# Patient Record
Sex: Female | Born: 1985
Health system: Southern US, Community
[De-identification: ages and names within clinical notes are randomized; demographics above are authoritative.]

## PROBLEM LIST (undated history)

## (undated) DIAGNOSIS — F329 Major depressive disorder, single episode, unspecified: Secondary | ICD-10-CM

## (undated) DIAGNOSIS — J45909 Unspecified asthma, uncomplicated: Secondary | ICD-10-CM

## (undated) DIAGNOSIS — F32A Depression, unspecified: Secondary | ICD-10-CM

## (undated) DIAGNOSIS — G43909 Migraine, unspecified, not intractable, without status migrainosus: Secondary | ICD-10-CM

## (undated) HISTORY — DX: Depression, unspecified: F32.A

## (undated) HISTORY — DX: Unspecified asthma, uncomplicated: J45.909

## (undated) HISTORY — DX: Major depressive disorder, single episode, unspecified: F32.9

---
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
## 2014-08-22 ENCOUNTER — Encounter: Payer: Self-pay | Admitting: Family Medicine

## 2014-08-22 ENCOUNTER — Ambulatory Visit (INDEPENDENT_AMBULATORY_CARE_PROVIDER_SITE_OTHER): Payer: 59 | Admitting: Family Medicine

## 2014-08-22 VITALS — BP 122/76 | HR 91 | Ht 67.0 in | Wt 211.0 lb

## 2014-08-22 DIAGNOSIS — M542 Cervicalgia: Secondary | ICD-10-CM | POA: Insufficient documentation

## 2014-08-22 DIAGNOSIS — M999 Biomechanical lesion, unspecified: Secondary | ICD-10-CM | POA: Insufficient documentation

## 2014-08-22 DIAGNOSIS — M9901 Segmental and somatic dysfunction of cervical region: Secondary | ICD-10-CM

## 2014-08-22 NOTE — Assessment & Plan Note (Signed)
Decision today to treat with OMT was based on Physical Exam  After verbal consent patient was treated with HVLA, ME, FPR techniques in cervical, rib thoracic areas  Patient tolerated the procedure well with improvement in symptoms  Patient given exercises, stretches and lifestyle modifications  See medications in patient instructions if given  Patient will follow up in 3-4 weeks

## 2014-08-22 NOTE — Progress Notes (Signed)
[name] [place], [place] [address] Phone: [phone] Subjective:     CC: shoulder and neck pain  LPN:[id number] Megan Dennis is a 28 y.o. female coming in with complaint of shoulder and neck pain. Patient states that this happened a couple weeks ago when she was doing a boot Class. Patient states that she was doing a lot of holding herself up. Patient states that the pain seemed to start in her shoulder and didn't seem to radiate up towards her neck. Patient states now it seems to be localized mostly in the left side of her neck as well as up towards her face. Patient states that she has significant tightness on the left side especially when she wakes up in the morning. Denies any radiation of the arm or any numbness. States that throughout the day it seems to improve. Patient rates the severity of pain is 4 out of 10. Still able to do daily activities without any significant discomfort. States though that the discomfort can keep her from falling asleep but does not wake her up at night.     Past medical history, social, surgical and family history all reviewed in electronic medical record.   Review of Systems: No headache, visual changes, nausea, vomiting, diarrhea, constipation, dizziness, abdominal pain, skin rash, fevers, chills, night sweats, weight loss, swollen lymph nodes, body aches, joint swelling, muscle aches, chest pain, shortness of breath, mood changes.   Objective Blood pressure 122/76, pulse 91, height 5\' 7"  (1.702 m), weight 211 lb (95.709 kg), SpO2 96 %.  General: No apparent distress alert and oriented x3 mood and affect normal, dressed appropriately.  HEENT: Pupils equal, extraocular movements intact  Respiratory: Patient's speak in full sentences and does not appear short of breath  Cardiovascular: No lower extremity edema, non tender, no erythema  Skin: Warm dry intact with no signs of infection or rash on extremities or  on axial skeleton.  Abdomen: Soft nontender  Neuro: Cranial nerves II through XII are intact, neurovascularly intact in all extremities with 2+ DTRs and 2+ pulses.  Lymph: No lymphadenopathy of posterior or anterior cervical chain or axillae bilaterally.  Gait normal with good balance and coordination.  MSK:  Non tender with full range of motion and good stability and symmetric strength and tone of  elbows, wrist, hip, knee and ankles bilaterally.  Neck: Inspection unremarkable. No palpable stepoffs. Negative Spurling's maneuver. Full neck range of motion Grip strength and sensation normal in bilateral hands Strength good C4 to T1 distribution No sensory change to C4 to T1 Negative Hoffman sign bilaterally Reflexes normal  Shoulder:left Inspection reveals no abnormalities, atrophy or asymmetry. Palpation is normal with no tenderness over AC joint or bicipital groove. ROM is full in all planes.patient does have increased range of motion compared to the contralateral side. Rotator cuff strength normal throughout. No signs of impingement with negative Neer and Hawkin's tests, empty can sign. Speeds and Yergason's tests normal. No labral pathology noted with negative Obrien's, negative clunk and good stability. Normal scapular function observed. No painful arc and no drop arm sign. No apprehension sign Contralateral shoulder unremarkable  OMT Physical Exam   Cervical  C2 flexed rotated and side bent right  Thoracic T1 extended rotated and side bent left with elevated first rib T5 E RS right   Lumbar L2 flexed rotated and side bent right  Sacrum  Illium        Impression and Recommendations:  This case required medical decision making of moderate complexity.

## 2014-08-22 NOTE — Patient Instructions (Addendum)
Very nice to meet you Ice 20 minutes 2 times daily. Usually after activity and before bed. Exercises 3 times a week.  Ibuprofen 600mg  3 times a day for 3 days if flare occurs.  On wall heels, butt shoulder and head touching for goal of 5 minutes  Our number [phone] Turmeric 500mg  twice daily Work on sleeping position keep elbow under shoulder and no fan on you! See me again after thanksgiving.

## 2014-08-22 NOTE — Assessment & Plan Note (Signed)
Patient is having neck pain that is likely secondary to muscle spasms. I do believe the patient was having pain in the early mornings is likely secondary to the sleeping position. We discussed about this in full detail. Differential also includes TMJ but I do not think that this is likely. Patient did have an elevated first rib and did tolerate the procedure well. Patient does respond well to osteopathic manipulation. We discussed icing protocol and given home exercise program. Patient will work on postural changes as well. Patient will follow-up with me again in 3-4 weeks for further evaluation and treatment. Patient also noted to be referred to integrative therapies for myofascial release.

## 2014-09-16 ENCOUNTER — Ambulatory Visit (INDEPENDENT_AMBULATORY_CARE_PROVIDER_SITE_OTHER): Payer: 59 | Admitting: Family Medicine

## 2014-09-16 ENCOUNTER — Encounter: Payer: Self-pay | Admitting: Family Medicine

## 2014-09-16 VITALS — BP 112/82 | HR 95 | Ht 67.0 in | Wt 209.0 lb

## 2014-09-16 DIAGNOSIS — M999 Biomechanical lesion, unspecified: Secondary | ICD-10-CM

## 2014-09-16 DIAGNOSIS — M2660 Temporomandibular joint disorder, unspecified: Secondary | ICD-10-CM

## 2014-09-16 DIAGNOSIS — M542 Cervicalgia: Secondary | ICD-10-CM

## 2014-09-16 DIAGNOSIS — M9901 Segmental and somatic dysfunction of cervical region: Secondary | ICD-10-CM

## 2014-09-16 DIAGNOSIS — M26609 Unspecified temporomandibular joint disorder, unspecified side: Secondary | ICD-10-CM | POA: Insufficient documentation

## 2014-09-16 NOTE — Assessment & Plan Note (Signed)
Long-standing discomfort. We discussed different range of motion exercises and we showed patient's self manipulation techniques that may be beneficial. We discussed icing regimen as well as topical anti-inflammatories. Patient was given a trial size of this medication. Patient will try these different changes and come back and see me. She continues to have trouble we can even consider injections as well as potential Botox injections in the area if needed.

## 2014-09-16 NOTE — Patient Instructions (Addendum)
Good to see you Ice is still your friend New exercises for the jaw, pull down then across, 30 reps daily Try the pennsaid twice daily as needed.  Consider chewing gum.  See me again in 5-6 weeks.

## 2014-09-16 NOTE — Assessment & Plan Note (Signed)
Decision today to treat with OMT was based on Physical Exam  After verbal consent patient was treated with HVLA, ME, FPR techniques in cervical, rib thoracic areas  Patient tolerated the procedure well with improvement in symptoms  Patient given exercises, stretches and lifestyle modifications  See medications in patient instructions if given  Patient will follow up in 3 weeks

## 2014-09-16 NOTE — Progress Notes (Signed)
  [facility] [address] [place], [place] [address] Phone: [phone] Subjective:     CC: shoulder and neck pain  [id number] Megan Dennis is a 28 y.o. female coming in with complaint of shoulder and neck pain. Patient was seen previously and was found to have an elevated first rib. Patient given different changes in sleep position, home exercises, as well as icing protocol. Patient states she is doing significantly better. Patient states she is not having the tightness in the side of her neck anymore.  Patient is coming in with a new problem. Patient does have TMJ. Patient has had this for quite some time and knows that she grinds her teeth. Patient is supposed to wear a brace at night which she does not do. Patient is having more and more pain on the left side. Patient states when she tries to open her mouth it is sore. Patient does not remember any true injury. Patient has not tried any home modalities at this time.     Past medical history, social, surgical and family history all reviewed in electronic medical record.   Review of Systems: No headache, visual changes, nausea, vomiting, diarrhea, constipation, dizziness, abdominal pain, skin rash, fevers, chills, night sweats, weight loss, swollen lymph nodes, body aches, joint swelling, muscle aches, chest pain, shortness of breath, mood changes.   Objective Blood pressure 112/82, pulse 95, height 5\' 7"  (1.702 m), weight 209 lb (94.802 kg), SpO2 99 %.  General: No apparent distress alert and oriented x3 mood and affect normal, dressed appropriately.  HEENT: Pupils equal, extraocular movements intact  Respiratory: Patient's speak in full sentences and does not appear short of breath  Cardiovascular: No lower extremity edema, non tender, no erythema  Skin: Warm dry intact with no signs of infection or rash on extremities or on axial skeleton.  Abdomen: Soft nontender  Neuro: Cranial nerves II through  XII are intact, neurovascularly intact in all extremities with 2+ DTRs and 2+ pulses.  Lymph: No lymphadenopathy of posterior or anterior cervical chain or axillae bilaterally.  Gait normal with good balance and coordination.  MSK:  Non tender with full range of motion and good stability and symmetric strength and tone of  elbows, wrist, hip, knee and ankles bilaterally.  Neck: Inspection unremarkable. No palpable stepoffs. Negative Spurling's maneuver. Full neck range of motion Grip strength and sensation normal in bilateral hands Strength good C4 to T1 distribution No sensory change to C4 to T1 Negative Hoffman sign bilaterally Reflexes normal Tender to palpation over the left TMJ area.   OMT Physical Exam   Cervical  C2 flexed rotated and side bent right C4 flexed rotated and side bent left  Thoracic T1 extended rotated and side bent left with elevated first rib T5 E RS right   Lumbar L2 flexed rotated and side bent right  Sacrum Left on left Illium nuetral       Impression and Recommendations:     This case required medical decision making of moderate complexity.

## 2014-09-16 NOTE — Assessment & Plan Note (Signed)
Patient responded very well to manipulation.  Continued to work on postural exercises as well as icing protocol. We discussed different changes she can make at work that may be beneficial. We discussed sleeping position still. Patient and will come back and see me again in 6 weeks for further evaluation.

## 2014-09-25 ENCOUNTER — Encounter: Payer: Self-pay | Admitting: Family Medicine

## 2014-10-07 ENCOUNTER — Ambulatory Visit: Payer: 59 | Admitting: Family

## 2014-10-15 ENCOUNTER — Ambulatory Visit (INDEPENDENT_AMBULATORY_CARE_PROVIDER_SITE_OTHER): Payer: 59 | Admitting: Family

## 2014-10-15 ENCOUNTER — Encounter: Payer: Self-pay | Admitting: Family

## 2014-10-15 VITALS — BP 110/78 | HR 76 | Temp 98.1°F | Resp 18 | Ht 67.0 in | Wt 213.6 lb

## 2014-10-15 DIAGNOSIS — Z0000 Encounter for general adult medical examination without abnormal findings: Secondary | ICD-10-CM

## 2014-10-15 DIAGNOSIS — Z7189 Other specified counseling: Secondary | ICD-10-CM

## 2014-10-15 DIAGNOSIS — Z7689 Persons encountering health services in other specified circumstances: Secondary | ICD-10-CM

## 2014-10-15 NOTE — Patient Instructions (Signed)
Thank you for choosing [facility].  Summary/Instructions:  Your prescription(s) have been submitted to your pharmacy. Please take as directed and contact our office if you believe you are having problem(s) with the medication(s).  Please stop by the lab on the basement level of the building for your blood work. Your results will be released to [facility] (or called to you) after review, usually within 72hours after test completion. If any changes need to be made, you will be notified at that same time.  Health Maintenance Adopting a healthy lifestyle and getting preventive care can go a long way to promote health and wellness. Talk with your health care provider about what schedule of regular examinations is right for you. This is a good chance for you to check in with your provider about disease prevention and staying healthy. In between checkups, there are plenty of things you can do on your own. Experts have done a lot of research about which lifestyle changes and preventive measures are most likely to keep you healthy. Ask your health care provider for more information. WEIGHT AND DIET  Eat a healthy diet  Be sure to include plenty of vegetables, fruits, low-fat dairy products, and lean protein.  Do not eat a lot of foods high in solid fats, added sugars, or salt.  Get regular exercise. This is one of the most important things you can do for your health.  Most adults should exercise for at least 150 minutes each week. The exercise should increase your heart rate and make you sweat (moderate-intensity exercise).  Most adults should also do strengthening exercises at least twice a week. This is in addition to the moderate-intensity exercise.  Maintain a healthy weight  Body mass index (BMI) is a measurement that can be used to identify possible weight problems. It estimates body fat based on height and weight. Your health care provider can help determine your BMI and help you achieve  or maintain a healthy weight.  For females 36 years of age and older:   A BMI below 18.5 is considered underweight.  A BMI of 18.5 to 24.9 is normal.  A BMI of 25 to 29.9 is considered overweight.  A BMI of 30 and above is considered obese.  Watch levels of cholesterol and blood lipids  You should start having your blood tested for lipids and cholesterol at 29 years of age, then have this test every 5 years.  You may need to have your cholesterol levels checked more often if:  Your lipid or cholesterol levels are high.  You are older than 29 years of age.  You are at high risk for heart disease.  CANCER SCREENING   Lung Cancer  Lung cancer screening is recommended for adults 66-45 years old who are at high risk for lung cancer because of a history of smoking.  A yearly low-dose CT scan of the lungs is recommended for people who:  Currently smoke.  Have quit within the past 15 years.  Have at least a 30-pack-year history of smoking. A pack year is smoking an average of one pack of cigarettes a day for 1 year.  Yearly screening should continue until it has been 15 years since you quit.  Yearly screening should stop if you develop a health problem that would prevent you from having lung cancer treatment.  Breast Cancer  Practice breast self-awareness. This means understanding how your breasts normally appear and feel.  It also means doing regular breast self-exams. Let your  health care provider know about any changes, no matter how small.  If you are in your 20s or 30s, you should have a clinical breast exam (CBE) by a health care provider every 1-3 years as part of a regular health exam.  If you are 23 or older, have a CBE every year. Also consider having a breast X-ray (mammogram) every year.  If you have a family history of breast cancer, talk to your health care provider about genetic screening.  If you are at high risk for breast cancer, talk to your health  care provider about having an MRI and a mammogram every year.  Breast cancer gene (BRCA) assessment is recommended for women who have family members with BRCA-related cancers. BRCA-related cancers include:  Breast.  Ovarian.  Tubal.  Peritoneal cancers.  Results of the assessment will determine the need for genetic counseling and BRCA1 and BRCA2 testing. Cervical Cancer Routine pelvic examinations to screen for cervical cancer are no longer recommended for nonpregnant women who are considered low risk for cancer of the pelvic organs (ovaries, uterus, and vagina) and who do not have symptoms. A pelvic examination may be necessary if you have symptoms including those associated with pelvic infections. Ask your health care provider if a screening pelvic exam is right for you.   The Pap test is the screening test for cervical cancer for women who are considered at risk.  If you had a hysterectomy for a problem that was not cancer or a condition that could lead to cancer, then you no longer need Pap tests.  If you are older than 65 years, and you have had normal Pap tests for the past 10 years, you no longer need to have Pap tests.  If you have had past treatment for cervical cancer or a condition that could lead to cancer, you need Pap tests and screening for cancer for at least 20 years after your treatment.  If you no longer get a Pap test, assess your risk factors if they change (such as having a new sexual partner). This can affect whether you should start being screened again.  Some women have medical problems that increase their chance of getting cervical cancer. If this is the case for you, your health care provider may recommend more frequent screening and Pap tests.  The human papillomavirus (HPV) test is another test that may be used for cervical cancer screening. The HPV test looks for the virus that can cause cell changes in the cervix. The cells collected during the Pap test can  be tested for HPV.  The HPV test can be used to screen women 66 years of age and older. Getting tested for HPV can extend the interval between normal Pap tests from three to five years.  An HPV test also should be used to screen women of any age who have unclear Pap test results.  After 29 years of age, women should have HPV testing as often as Pap tests.  Colorectal Cancer  This type of cancer can be detected and often prevented.  Routine colorectal cancer screening usually begins at 29 years of age and continues through 29 years of age.  Your health care provider may recommend screening at an earlier age if you have risk factors for colon cancer.  Your health care provider may also recommend using home test kits to check for hidden blood in the stool.  A small camera at the end of a tube can be used to examine  your colon directly (sigmoidoscopy or colonoscopy). This is done to check for the earliest forms of colorectal cancer.  Routine screening usually begins at age 78.  Direct examination of the colon should be repeated every 5-10 years through 29 years of age. However, you may need to be screened more often if early forms of precancerous polyps or small growths are found. Skin Cancer  Check your skin from head to toe regularly.  Tell your health care provider about any new moles or changes in moles, especially if there is a change in a mole's shape or color.  Also tell your health care provider if you have a mole that is larger than the size of a pencil eraser.  Always use sunscreen. Apply sunscreen liberally and repeatedly throughout the day.  Protect yourself by wearing long sleeves, pants, a wide-brimmed hat, and sunglasses whenever you are outside. HEART DISEASE, DIABETES, AND HIGH BLOOD PRESSURE   Have your blood pressure checked at least every 1-2 years. High blood pressure causes heart disease and increases the risk of stroke.  If you are between 89 years and 63  years old, ask your health care provider if you should take aspirin to prevent strokes.  Have regular diabetes screenings. This involves taking a blood sample to check your fasting blood sugar level.  If you are at a normal weight and have a low risk for diabetes, have this test once every three years after 29 years of age.  If you are overweight and have a high risk for diabetes, consider being tested at a younger age or more often. PREVENTING INFECTION  Hepatitis B  If you have a higher risk for hepatitis B, you should be screened for this virus. You are considered at high risk for hepatitis B if:  You were born in a country where hepatitis B is common. Ask your health care provider which countries are considered high risk.  Your parents were born in a high-risk country, and you have not been immunized against hepatitis B (hepatitis B vaccine).  You have HIV or AIDS.  You use needles to inject street drugs.  You live with someone who has hepatitis B.  You have had sex with someone who has hepatitis B.  You get hemodialysis treatment.  You take certain medicines for conditions, including cancer, organ transplantation, and autoimmune conditions. Hepatitis C  Blood testing is recommended for:  Everyone born from [birth year] through 1966.  Anyone with known risk factors for hepatitis C. Sexually transmitted infections (STIs)  You should be screened for sexually transmitted infections (STIs) including gonorrhea and chlamydia if:  You are sexually active and are younger than 29 years of age.  You are older than 29 years of age and your health care provider tells you that you are at risk for this type of infection.  Your sexual activity has changed since you were last screened and you are at an increased risk for chlamydia or gonorrhea. Ask your health care provider if you are at risk.  If you do not have HIV, but are at risk, it may be recommended that you take a prescription  medicine daily to prevent HIV infection. This is called pre-exposure prophylaxis (PrEP). You are considered at risk if:  You are sexually active and do not regularly use condoms or know the HIV status of your partner(s).  You take drugs by injection.  You are sexually active with a partner who has HIV. Talk with your health care provider about whether  you are at high risk of being infected with HIV. If you choose to begin PrEP, you should first be tested for HIV. You should then be tested every 3 months for as long as you are taking PrEP.  PREGNANCY   If you are premenopausal and you may become pregnant, ask your health care provider about preconception counseling.  If you may become pregnant, take 400 to 800 micrograms (mcg) of folic acid every day.  If you want to prevent pregnancy, talk to your health care provider about birth control (contraception). OSTEOPOROSIS AND MENOPAUSE   Osteoporosis is a disease in which the bones lose minerals and strength with aging. This can result in serious bone fractures. Your risk for osteoporosis can be identified using a bone density scan.  If you are 67 years of age or older, or if you are at risk for osteoporosis and fractures, ask your health care provider if you should be screened.  Ask your health care provider whether you should take a calcium or vitamin D supplement to lower your risk for osteoporosis.  Menopause may have certain physical symptoms and risks.  Hormone replacement therapy may reduce some of these symptoms and risks. Talk to your health care provider about whether hormone replacement therapy is right for you.  HOME CARE INSTRUCTIONS   Schedule regular health, dental, and eye exams.  Stay current with your immunizations.   Do not use any tobacco products including cigarettes, chewing tobacco, or electronic cigarettes.  If you are pregnant, do not drink alcohol.  If you are breastfeeding, limit how much and how often you  drink alcohol.  Limit alcohol intake to no more than 1 drink per day for nonpregnant women. One drink equals 12 ounces of beer, 5 ounces of wine, or 1 ounces of hard liquor.  Do not use street drugs.  Do not share needles.  Ask your health care provider for help if you need support or information about quitting drugs.  Tell your health care provider if you often feel depressed.  Tell your health care provider if you have ever been abused or do not feel safe at home. Document Released: 04/18/2011 Document Revised: 02/17/2014 Document Reviewed: 09/04/2013 [facility] Patient Information 2015 [place], [place]. This information is not intended to replace advice given to you by your health care provider. Make sure you discuss any questions you have with your health care provider.

## 2014-10-15 NOTE — Progress Notes (Signed)
Pre visit review using our clinic review tool, if applicable. No additional management support is needed unless otherwise documented below in the visit note. 

## 2014-10-15 NOTE — Assessment & Plan Note (Signed)
1) Anticipatory Guidance: Discussed importance of wearing a seatbelt while driving and not texting while driving; changing batteries in smoke detector at least once annually; wearing suntan lotion when outside; eating a balanced and moderate diet; getting physical activity at least 30 minutes per day.   2) Immunizations / Screenings / Labs:  Immunizations are up to date with recommendations. Is due for dental, vision and GYN exams. Referred to vision and GYN. Has appointment for dental in Feb. 2016. All other recommended screenings are up to date. Obtain CBC, BMET, Lipid profile and TSH when fasting.   Overall well exam. Discussed risk factor of obesity and goal of losing 5-10% of her body weight. Recommend increasing fruit and vegetable intake while decreasing saturated and trans fat intake. Increase physical activity to 30 minutes most days of the week. Follow up prevention exam in 1 year, or sooner pending lab work.

## 2014-10-15 NOTE — Progress Notes (Signed)
Subjective:    Patient ID: Megan Dennis, female    DOB: 05-08-1986, 28 y.o.   MRN: [id number]  Chief Complaint  Patient presents with  . Establish Care    check up, not fasting   HPI:  Megan Dennis is a 28 y.o. female who presents today for an annual wellness visit.   1) Health Maintenance - Feels good and overall health good.   Diet - Occasional fruits and vegetables Exercise - gym most days of the week - mixture of classes and weights.   2) Preventative Exams / Immunizations:  Dental -- Due for exam (Appt in Feb.) Vision -- Due for exam.   Health Maintenance  Topic Date Due  . INFLUENZA VACCINE  05/18/2015  . PAP SMEAR  08/21/2015  . TETANUS/TDAP  05/20/2017    There is no immunization history on file for this patient.  3) Depression - Well controlled with the Prozac.  Review of Systems  Constitutional: Denies fever, chills, fatigue, or significant weight gain/loss. HENT: Head: Denies headache or neck pain Ears: Denies changes in hearing, ringing in ears, earache, drainage Nose: Denies discharge, stuffiness, itching, nosebleed, sinus pain Throat: Denies sore throat, hoarseness, dry mouth, sores, thrush Eyes: Denies loss/changes in vision, pain, redness, blurry/double vision, flashing lights Cardiovascular: Denies chest pain/discomfort, tightness, palpitations, shortness of breath with activity, difficulty lying down, swelling, sudden awakening with shortness of breath Respiratory: Denies shortness of breath, cough, sputum production, wheezing Gastrointestinal: Denies dysphasia, heartburn, change in appetite, nausea, change in bowel habits, rectal bleeding, constipation, diarrhea, yellow skin or eyes Genitourinary: Denies frequency, urgency, burning/pain, blood in urine, incontinence, change in urinary strength. Musculoskeletal: Denies muscle/joint pain, stiffness, back pain, redness or swelling of joints, trauma TMJ - Being followed by Dr. Tamala [facility] Skin: Denies rashes, lumps, itching, dryness, color changes, or hair/nail changes Neurological: Denies dizziness, fainting, seizures, weakness, numbness, tingling, tremor Psychiatric - Denies nervousness, stress, depression or memory loss Endocrine: Denies heat or cold intolerance, sweating, frequent urination, excessive thirst, changes in appetite Hematologic: Denies ease of bruising or bleeding    Objective:    BP 110/78 mmHg  Pulse 76  Temp(Src) 98.1 F (36.7 C) (Oral)  Resp 18  Ht 5\' 7"  (1.702 m)  Wt 213 lb 9.6 oz (96.888 kg)  BMI 33.45 kg/m2  SpO2 95% Nursing note and vital signs reviewed.  Physical Exam  Constitutional: She is oriented to person, place, and time. She appears well-developed and well-nourished.  HENT:  Head: Normocephalic.  Right Ear: Hearing, tympanic membrane, external ear and ear canal normal.  Left Ear: Hearing, tympanic membrane, external ear and ear canal normal.  Nose: Nose normal.  Mouth/Throat: Uvula is midline, oropharynx is clear and moist and mucous membranes are normal.  Eyes: Conjunctivae and EOM are normal. Pupils are equal, round, and reactive to light.  Neck: Neck supple. No JVD present. No tracheal deviation present. No thyromegaly present.  Cardiovascular: Normal rate, regular rhythm, normal heart sounds and intact distal pulses.   Pulmonary/Chest: Effort normal and breath sounds normal.  Abdominal: Soft. Bowel sounds are normal. She exhibits no distension and no mass. There is no tenderness. There is no rebound and no guarding.  Musculoskeletal: Normal range of motion. She exhibits no edema or tenderness.  Lymphadenopathy:    She has no cervical adenopathy.  Neurological: She is alert and oriented to person, place, and time. She has normal reflexes. No cranial nerve deficit. She exhibits normal muscle tone. Coordination normal.  Skin: Skin  is warm and dry.  Psychiatric: She has a normal mood and affect. Her behavior is normal.  Judgment and thought content normal.       Assessment & Plan:

## 2014-10-22 ENCOUNTER — Encounter: Payer: Self-pay | Admitting: Internal Medicine

## 2014-10-22 ENCOUNTER — Ambulatory Visit (INDEPENDENT_AMBULATORY_CARE_PROVIDER_SITE_OTHER): Payer: 59 | Admitting: Internal Medicine

## 2014-10-22 VITALS — BP 102/68 | HR 108 | Temp 98.3°F | Ht 67.0 in | Wt 212.0 lb

## 2014-10-22 DIAGNOSIS — J029 Acute pharyngitis, unspecified: Secondary | ICD-10-CM | POA: Insufficient documentation

## 2014-10-22 MED ORDER — AZITHROMYCIN 250 MG PO TABS: ORAL_TABLET | ORAL | Status: DC

## 2014-10-22 NOTE — Progress Notes (Signed)
   Subjective:    Patient ID: Megan Dennis, female    DOB: [birth year], 29 y.o.   MRN: [id number]  HPI   Here with 2-3 days acute onset fever, severe ST,  pressure, headache, general weakness and malaise, and greenish d/c, with mild non cough, but pt denies chest pain, wheezing, increased sob or doe, orthopnea, PND, increased LE swelling, palpitations, dizziness or syncope. Past Medical History  Diagnosis Date  . Depression   . Asthma     Exercised Induced   Past Surgical History  Procedure Laterality Date  . Lipoma removal    . Tonsillectomy      reports that she has never smoked. She has never used smokeless tobacco. She reports that she drinks alcohol. She reports that she does not use illicit drugs. family history includes Dementia in her paternal grandfather; Hyperlipidemia in her maternal grandmother. No Known Allergies Current Outpatient Prescriptions on File Prior to Visit  Medication Sig Dispense Refill  . FLUoxetine (PROZAC) 40 MG capsule Take 40 mg by mouth daily.     No current facility-administered medications on file prior to visit.    Review of Systems All otherwise neg per pt     Objective:   Physical Exam BP 102/68 mmHg  Pulse 108  Temp(Src) 98.3 F (36.8 C) (Oral)  Ht 5\' 7"  (1.702 m)  Wt 212 lb (96.163 kg)  BMI 33.20 kg/m2  SpO2 95% VS noted, mild ill Constitutional: Pt appears well-developed, well-nourished.  HENT: Head: NCAT.  Right Ear: External ear normal.  Left Ear: External ear normal.  Eyes: . Pupils are equal, round, and reactive to light. Conjunctivae and EOM are normal Bilat tm's with mild erythema.  Max sinus areas non tender.  Pharynx with severe erythema, + exudate Neck: Normal range of motion. Neck supple. with bilat submandibular LA Cardiovascular: Normal rate and regular rhythm.   Pulmonary/Chest: Effort normal and breath sounds without rales or wheezing.  Neurological: Pt is alert. Not confused , motor grossly intact Skin: Skin is  warm. No rash Psychiatric: Pt behavior is normal. No agitation.     Assessment & Plan:

## 2014-10-22 NOTE — Assessment & Plan Note (Signed)
Mild to mod, for antibx course,  to f/u any worsening symptoms or concerns 

## 2014-10-22 NOTE — Patient Instructions (Signed)
Please take all new medication as prescribed - the antibiotic  You can also take Delsym OTC for cough, and/or Mucinex (or it's generic off brand) for congestion, and tylenol as needed for pain.  Please continue all other medications as before, and refills have been done if requested.

## 2014-10-22 NOTE — Progress Notes (Signed)
Pre visit review using our clinic review tool, if applicable. No additional management support is needed unless otherwise documented below in the visit note. 

## 2014-10-25 NOTE — Addendum Note (Signed)
Addended by: Biagio Borg on: 10/25/2014 06:33 PM   Modules accepted: Miquel Dunn

## 2014-10-27 ENCOUNTER — Encounter: Payer: Self-pay | Admitting: Family Medicine

## 2014-10-27 ENCOUNTER — Ambulatory Visit (INDEPENDENT_AMBULATORY_CARE_PROVIDER_SITE_OTHER): Payer: 59 | Admitting: Family Medicine

## 2014-10-27 VITALS — BP 112/82 | HR 102 | Ht 67.0 in | Wt 216.0 lb

## 2014-10-27 DIAGNOSIS — M26609 Unspecified temporomandibular joint disorder, unspecified side: Secondary | ICD-10-CM

## 2014-10-27 DIAGNOSIS — M2660 Temporomandibular joint disorder, unspecified: Secondary | ICD-10-CM

## 2014-10-27 DIAGNOSIS — M542 Cervicalgia: Secondary | ICD-10-CM

## 2014-10-27 DIAGNOSIS — M999 Biomechanical lesion, unspecified: Secondary | ICD-10-CM

## 2014-10-27 DIAGNOSIS — M9901 Segmental and somatic dysfunction of cervical region: Secondary | ICD-10-CM

## 2014-10-27 MED ORDER — NORTRIPTYLINE HCL 10 MG PO CAPS: 10.0000 mg | ORAL_CAPSULE | Freq: Every day | ORAL | Status: DC

## 2014-10-27 MED ORDER — HYDROXYZINE HCL 25 MG PO TABS: 25.0000 mg | ORAL_TABLET | Freq: Three times a day (TID) | ORAL | Status: DC | PRN

## 2014-10-27 NOTE — Assessment & Plan Note (Signed)
We will attempt patient to continue the home exercises in the prescribed medications as described above. Patient continues to have difficulty we may need to refer to a specialist.

## 2014-10-27 NOTE — Assessment & Plan Note (Signed)
Decision today to treat with OMT was based on Physical Exam  After verbal consent patient was treated with HVLA, ME, FPR techniques in cervical, thoracic areas  Patient tolerated the procedure well with improvement in symptoms  Patient given exercises, stretches and lifestyle modifications  See medications in patient instructions if given  Patient will follow up in 3 weeks

## 2014-10-27 NOTE — Patient Instructions (Addendum)
Great to see you as always.  Happy New Year! Try nortriptyline nightly.  Hydroxyzine up to 3 times a daily as needed Continue what you are doing Call me in 2 weeks if not better will find someone for tmj Otherwise see me in 4 weeks.

## 2014-10-27 NOTE — Progress Notes (Signed)
  [name] [address], [place] [address] Phone: [phone] Subjective:     CC: shoulder and neck pain  [id number] Megan Dennis is a 29 y.o. female coming in with complaint of shoulder and neck pain. Continues to try the exercises as well as watch her sleeping position. Patient has continued to make some improvement. Patient states that she continues to have headaches but she thinks this is secondary to her TMJ. Patient has been trying to do the home exercises for the TMJ and still not having any significant success. Patient tried a trial of topical anti-inflammatoriesbut no significant relief. Patient continues to be very stress which she thinks is also contribute in.       Past medical history, social, surgical and family history all reviewed in electronic medical record.   Review of Systems: No headache, visual changes, nausea, vomiting, diarrhea, constipation, dizziness, abdominal pain, skin rash, fevers, chills, night sweats, weight loss, swollen lymph nodes, body aches, joint swelling, muscle aches, chest pain, shortness of breath, mood changes.   Objective Blood pressure 112/82, pulse 102, height 5\' 7"  (1.702 m), weight 216 lb (97.977 kg), SpO2 98 %.  General: No apparent distress alert and oriented x3 mood and affect normal, dressed appropriately.  HEENT: Pupils equal, extraocular movements intact  Respiratory: Patient's speak in full sentences and does not appear short of breath  Cardiovascular: No lower extremity edema, non tender, no erythema  Skin: Warm dry intact with no signs of infection or rash on extremities or on axial skeleton.  Abdomen: Soft nontender  Neuro: Cranial nerves II through XII are intact, neurovascularly intact in all extremities with 2+ DTRs and 2+ pulses.  Lymph: No lymphadenopathy of posterior or anterior cervical chain or axillae bilaterally.  Gait normal with good balance and coordination.  MSK:  Non  tender with full range of motion and good stability and symmetric strength and tone of  elbows, wrist, hip, knee and ankles bilaterally.  Neck: Inspection unremarkable. No palpable stepoffs. Negative Spurling's maneuver. Full neck range of motion Grip strength and sensation normal in bilateral hands Strength good C4 to T1 distribution No sensory change to C4 to T1 Negative Hoffman sign bilaterally Reflexes normal Tender to palpation over the left TMJ area still.    OMT Physical Exam   Cervical  C2 flexed rotated and side bent right C4 flexed rotated and side bent left  Thoracic T1 extended rotated and side bent left with rib in place T5 E RS right   Lumbar L2 flexed rotated and side bent right  Sacrum Left on left Near exact pattern his previous time.   Impression and Recommendations:     This case required medical decision making of moderate complexity.

## 2014-10-27 NOTE — Assessment & Plan Note (Signed)
Patient's pain could also be cause due to her being somewhat stress. We decided to do a trial of nortriptyline at night to see if this will help her with her sleep as well as the TMJ pain. Patient was given hydroxyzine to see if that will help during the day. We discussed icing regimen and home exercises. We discussed continuing the postural changes. Patient will follow-up again in 3-4 weeks for further evaluation and treatment.

## 2014-11-19 ENCOUNTER — Other Ambulatory Visit: Payer: Self-pay | Admitting: *Deleted

## 2014-11-19 MED ORDER — FLUOXETINE HCL 40 MG PO CAPS: 40.0000 mg | ORAL_CAPSULE | Freq: Every day | ORAL | Status: DC

## 2014-11-19 NOTE — Telephone Encounter (Signed)
Left msg needing refill sent on her fluoxetine. Called pt back no answer & can't leave msg due to vm being full. Sent refill to [facility]...Johny Chess

## 2014-11-22 ENCOUNTER — Encounter (HOSPITAL_COMMUNITY): Payer: Self-pay | Admitting: *Deleted

## 2014-11-22 ENCOUNTER — Emergency Department (HOSPITAL_COMMUNITY)
Admission: EM | Admit: 2014-11-22 | Discharge: 2014-11-22 | Disposition: A | Discharge status: Home / Self Care | Payer: 59 | Source: Home / Self Care | Attending: Emergency Medicine | Admitting: Emergency Medicine

## 2014-11-22 DIAGNOSIS — R0982 Postnasal drip: Secondary | ICD-10-CM

## 2014-11-22 HISTORY — DX: Migraine, unspecified, not intractable, without status migrainosus: G43.909

## 2014-11-22 LAB — POCT INFECTIOUS MONO SCREEN: Mono Screen: NEGATIVE

## 2014-11-22 LAB — POCT RAPID STREP A: Streptococcus, Group A Screen (Direct): NEGATIVE

## 2014-11-22 MED ORDER — CETIRIZINE HCL 10 MG PO TABS: 10.0000 mg | ORAL_TABLET | Freq: Every day | ORAL | Status: DC

## 2014-11-22 MED ORDER — FLUTICASONE PROPIONATE 50 MCG/ACT NA SUSP: 1.0000 | Freq: Every day | NASAL | Status: DC

## 2014-11-22 NOTE — ED Provider Notes (Signed)
CSN: [id number]     Arrival date & time 11/22/14  1112 History   First MD Initiated Contact with Patient 11/22/14 1141     Chief Complaint  Patient presents with  . Sore Throat   (Consider location/radiation/quality/duration/timing/severity/associated sxs/prior Treatment) HPI  She is a 29 year old woman here for evaluation of sore throat. She states this is been going on for about 3 months. It fluctuates day-to-day. Initially, she had fevers, but these have since resolved. She also reports intermittent cough and rhinorrhea. No difficulty swallowing. No fevers or chills. No nausea or vomiting. She does report some fatigue. She was treated last month with a Z-Pak which seemed to provide some temporary improvement. She has been using throat lozenges at home with mild improvement.  Past Medical History  Diagnosis Date  . Depression   . Migraines   . Asthma     Exercised Induced, and as a child   Past Surgical History  Procedure Laterality Date  . Lipoma removal  1992  . Tonsillectomy  2000  . Widsom teeth  2002   Family History  Problem Relation Age of Onset  . Hyperlipidemia Maternal Grandmother   . Dementia Paternal Grandfather    History  Substance Use Topics  . Smoking status: Never Smoker   . Smokeless tobacco: Never Used  . Alcohol Use: 0.0 oz/week    0 Not specified per week     Comment: rarely   OB History    No data available     Review of Systems  Constitutional: Positive for fatigue. Negative for fever.  HENT: Positive for rhinorrhea and sore throat. Negative for congestion and trouble swallowing.   Respiratory: Positive for cough. Negative for shortness of breath.     Allergies  Review of patient's allergies indicates no known allergies.  Home Medications   Prior to Admission medications   Medication Sig Start Date End Date Taking? Authorizing Provider  FLUoxetine (PROZAC) 40 MG capsule Take 1 capsule (40 mg total) by mouth daily. 11/19/14  Yes Mauricio Po, FNP  hydrOXYzine (ATARAX/VISTARIL) 25 MG tablet Take 1 tablet (25 mg total) by mouth 3 (three) times daily as needed. 10/27/14  Yes Lyndal Pulley, DO  nortriptyline (PAMELOR) 10 MG capsule Take 1 capsule (10 mg total) by mouth at bedtime. 10/27/14  Yes Lyndal Pulley, DO  cetirizine (ZYRTEC) 10 MG tablet Take 1 tablet (10 mg total) by mouth daily. 11/22/14   Melony Overly, MD  fluticasone (FLONASE) 50 MCG/ACT nasal spray Place 1 spray into both nostrils daily. 11/22/14   Melony Overly, MD   BP 116/83 mmHg  Pulse 95  Temp(Src) 98.3 F (36.8 C) (Oral)  Resp 18  SpO2 97%  LMP 11/02/2014 Physical Exam  Constitutional: She is oriented to person, place, and time. She appears well-developed and well-nourished. No distress.  HENT:  Right Ear: Tympanic membrane normal.  Left Ear: Tympanic membrane normal.  Nose: Rhinorrhea present.  Mouth/Throat: Mucous membranes are not dry. Posterior oropharyngeal erythema (worse on right) present.  Mild cobblestoning present  Neck: Neck supple.  Cardiovascular: Normal rate, regular rhythm and normal heart sounds.   No murmur heard. Pulmonary/Chest: Effort normal and breath sounds normal. No respiratory distress. She has no wheezes. She has no rales.  Lymphadenopathy:    She has no cervical adenopathy.  Neurological: She is alert and oriented to person, place, and time.    ED Course  Procedures (including critical care time) Labs Review Labs Reviewed  POCT RAPID  STREP A ([facility] CARE ONLY)  POCT INFECTIOUS MONO SCREEN    Imaging Review No results found.   MDM   1. PND (post-nasal drip)    Monospot and rapid strep negative. We'll treat PND with Zyrtec and Flonase. Follow-up as needed.    Melony Overly, MD 11/22/14 1229

## 2014-11-22 NOTE — ED Notes (Signed)
C/o sore throat onset mid Jan.  She went to [facility] and got a Z-pack.  States it got better and then got worse.  She had Migraine on Monday.  Has a had a cough on and off. No runny nose or fever.  C/o fatigue and thought it might be Mono.

## 2014-11-22 NOTE — Discharge Instructions (Signed)
Your sore throat is likely related to postnasal drip. Take Zyrtec daily. Use Flonase daily. You can pick up these medications on Monday at the outpatient pharmacy. Follow-up if no improvement in 2 weeks.

## 2014-11-24 LAB — CULTURE, GROUP A STREP

## 2014-12-08 ENCOUNTER — Ambulatory Visit (INDEPENDENT_AMBULATORY_CARE_PROVIDER_SITE_OTHER): Payer: 59 | Admitting: Family Medicine

## 2014-12-08 ENCOUNTER — Encounter: Payer: Self-pay | Admitting: Family Medicine

## 2014-12-08 VITALS — BP 112/82 | HR 78 | Ht 67.0 in | Wt 209.0 lb

## 2014-12-08 DIAGNOSIS — M26609 Unspecified temporomandibular joint disorder, unspecified side: Secondary | ICD-10-CM

## 2014-12-08 DIAGNOSIS — M9901 Segmental and somatic dysfunction of cervical region: Secondary | ICD-10-CM

## 2014-12-08 DIAGNOSIS — M999 Biomechanical lesion, unspecified: Secondary | ICD-10-CM

## 2014-12-08 DIAGNOSIS — M2660 Temporomandibular joint disorder, unspecified: Secondary | ICD-10-CM

## 2014-12-08 DIAGNOSIS — J029 Acute pharyngitis, unspecified: Secondary | ICD-10-CM

## 2014-12-08 DIAGNOSIS — M542 Cervicalgia: Secondary | ICD-10-CM

## 2014-12-08 NOTE — Assessment & Plan Note (Signed)
Patient's neck pain is still considered more postural anything else. Patient continues to work on this. Patient tries to work out on a regular basis which has been very helpful. We discussed icing regimen at home exercises again in greater detail. Patient and will follow-up and see me again in 3-4 weeks.

## 2014-12-08 NOTE — Assessment & Plan Note (Signed)
Referred to ENT

## 2014-12-08 NOTE — Progress Notes (Signed)
  [facility] [address] [place], [place] [address] Phone: [phone] Subjective:     CC: shoulder and neck pain follow up  [id number] Megan Dennis is a 29 y.o. female coming in with complaint of shoulder and neck pain.  Patient states that her neck pain overall has been doing fairly well as long she continues to do the exercises. Patient does have TMJ and continues to have some discomfort and would like referral to ENT especially with her having more of a chronic pharyngitis over the course last 3 months.       Past medical history, social, surgical and family history all reviewed in electronic medical record.   Review of Systems: No headache, visual changes, nausea, vomiting, diarrhea, constipation, dizziness, abdominal pain, skin rash, fevers, chills, night sweats, weight loss, swollen lymph nodes, body aches, joint swelling, muscle aches, chest pain, shortness of breath, mood changes.   Objective Blood pressure 112/82, pulse 78, height 5\' 7"  (1.702 m), weight 209 lb (94.802 kg), last menstrual period 11/02/2014, SpO2 97 %.  General: No apparent distress alert and oriented x3 mood and affect normal, dressed appropriately.  HEENT: Pupils equal, extraocular movements intact  Respiratory: Patient's speak in full sentences and does not appear short of breath  Cardiovascular: No lower extremity edema, non tender, no erythema  Skin: Warm dry intact with no signs of infection or rash on extremities or on axial skeleton.  Abdomen: Soft nontender  Neuro: Cranial nerves II through XII are intact, neurovascularly intact in all extremities with 2+ DTRs and 2+ pulses.  Lymph: No lymphadenopathy of posterior or anterior cervical chain or axillae bilaterally.  Gait normal with good balance and coordination.  MSK:  Non tender with full range of motion and good stability and symmetric strength and tone of  elbows, wrist, hip, knee and ankles bilaterally.   Neck: Inspection unremarkable. No palpable stepoffs. Negative Spurling's maneuver. Full neck range of motion Grip strength and sensation normal in bilateral hands Strength good C4 to T1 distribution No sensory change to C4 to T1 Negative Hoffman sign bilaterally Reflexes normal Tender to palpation over the left TMJ area still but improved from previous exam    OMT Physical Exam   Cervical  C2 flexed rotated and side bent right C4 flexed rotated and side bent left  Thoracic T1 extended rotated and side bent left with rib in place T5 E RS right   Lumbar L2 flexed rotated and side bent right  Sacrum Left on left Near exact pattern his previous time.   Impression and Recommendations:     This case required medical decision making of moderate complexity.

## 2014-12-08 NOTE — Assessment & Plan Note (Signed)
Decision today to treat with OMT was based on Physical Exam  After verbal consent patient was treated with HVLA, ME, FPR techniques in cervical, thoracic areas  Patient tolerated the procedure well with improvement in symptoms  Patient given exercises, stretches and lifestyle modifications  See medications in patient instructions if given  Patient will follow up in 4-6 weeks

## 2014-12-08 NOTE — Assessment & Plan Note (Signed)
Patient has had multiple recurrent episodes. Patient will be referred to ENT. Discussed with patient possibly keeping a food diary to see if there is any other triggers.

## 2014-12-08 NOTE — Patient Instructions (Addendum)
Good to see you Ice when you need it Continue the posture and sleeping position they are working,  We will get you in with ENT soon.  Lets see you again in 6 weeks.

## 2014-12-08 NOTE — Progress Notes (Signed)
Pre visit review using our clinic review tool, if applicable. No additional management support is needed unless otherwise documented below in the visit note. 

## 2015-01-13 ENCOUNTER — Encounter: Payer: Self-pay | Admitting: Family

## 2015-03-10 ENCOUNTER — Encounter: Payer: Self-pay | Admitting: Internal Medicine

## 2015-03-10 ENCOUNTER — Other Ambulatory Visit: Payer: Self-pay

## 2015-03-10 ENCOUNTER — Ambulatory Visit (INDEPENDENT_AMBULATORY_CARE_PROVIDER_SITE_OTHER): Payer: 59 | Admitting: Internal Medicine

## 2015-03-10 VITALS — BP 118/62 | HR 120 | Temp 99.0°F | Ht 67.0 in | Wt 208.5 lb

## 2015-03-10 DIAGNOSIS — R509 Fever, unspecified: Secondary | ICD-10-CM | POA: Diagnosis not present

## 2015-03-10 DIAGNOSIS — B349 Viral infection, unspecified: Secondary | ICD-10-CM | POA: Diagnosis not present

## 2015-03-10 DIAGNOSIS — R52 Pain, unspecified: Secondary | ICD-10-CM | POA: Diagnosis not present

## 2015-03-10 LAB — POCT INFLUENZA A/B
Influenza A, POC: NEGATIVE
Influenza B, POC: NEGATIVE

## 2015-03-10 NOTE — Patient Instructions (Addendum)
   Rest, fluids, Tylenol or Motrin. Pepto-Bismol as needed if diarrhea  Call anytime if you have high fever, rash, severe headache, intense diarrhea, blood in the stools or increased abdominal pain. Call if not improving in the next few days

## 2015-03-10 NOTE — Progress Notes (Signed)
Subjective:    Patient ID: Megan Dennis, female    DOB: 1986/05/04, 29 y.o.   MRN: [id number]  DOS:  03/10/2015 Type of visit - description : acute Interval history: Symptoms started this morning with lower abdominal cramps, generalized muscle aches, malaise, dizziness, mild nausea. She had a temperature of 99.5. Concern about something serious like the flu  Review of Systems + Chills, no sore throat. No difficulty breathing, cough or sputum production. Mild headache, no rash No vomiting, did have loose stools today without blood.   Past Medical History  Diagnosis Date  . Depression   . Migraines   . Asthma     Exercised Induced, and as a child    Past Surgical History  Procedure Laterality Date  . Lipoma removal  1992  . Tonsillectomy  2000  . Widsom teeth  2002    History   Social History  . Marital Status: Single    Spouse Name: N/A  . Number of Children: 0  . Years of Education: 18   Occupational History  . 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 Smoking status: Never Smoker   . Smokeless tobacco: Never Used  . Alcohol Use: 0.0 oz/week    0 Standard drinks or equivalent per week     Comment: rarely  . Drug Use: No  . Sexual Activity: Yes    Birth Control/ Protection: None   Other Topics Concern  . Not on file   Social History Narrative   Born and raised in [place]. 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 Fun: Sleep when not working.    Denies religious beliefs effecting health care.         Medication List       This list is accurate as of: 03/10/15 11:59 PM.  Always use your most recent med list.               cetirizine 10 MG tablet  Commonly known as:  ZYRTEC  Take 1 tablet (10 mg total) by mouth daily.     FLUoxetine 40 MG capsule  Commonly known as:  PROZAC  Take 1 capsule (40 mg total) by mouth daily.     fluticasone 50 MCG/ACT nasal spray  Commonly known as:  FLONASE  Place 1 spray into both nostrils  daily.     hydrOXYzine 25 MG tablet  Commonly known as:  ATARAX/VISTARIL  Take 1 tablet (25 mg total) by mouth 3 (three) times daily as needed.     nortriptyline 10 MG capsule  Commonly known as:  PAMELOR  Take 1 capsule (10 mg total) by mouth at bedtime.           Objective:   Physical Exam BP 118/62 mmHg  Pulse 120  Temp(Src) 99 F (37.2 C) (Oral)  Ht 5\' 7"  (1.702 m)  Wt 208 lb 8 oz (94.575 kg)  BMI 32.65 kg/m2  SpO2 98%  LMP 03/06/2015 (Exact Date) Heart rate recheck around 90 General:   Well developed, well nourished . NAD.  HEENT:  Normocephalic . Face symmetric, atraumatic TMs normal, nose is slightly congested, throat symmetric, no red Neck:  Subtle, no LADs Lungs:  CTA B Normal respiratory effort, no intercostal retractions, no accessory muscle use. Heart: RRR,  no murmur.  no pretibial edema bilaterally  Abdomen:  Not distended, soft, + bowel sounds, mildly tender at the lower abdomen bilaterally without mass or rebound Skin: Not pale. Not jaundice  Neurologic:  alert & oriented X3.  Speech normal, gait appropriate for age and unassisted Psych--  Cognition and judgment appear intact.  Cooperative with normal attention span and concentration.  Behavior appropriate. No anxious or depressed appearing.      Assessment & Plan:    Viral syndrome, Symptoms likely due to viral syndrome, patient is concerned about something serious including influenza. Flu test today negative. At this point there is no evidence of pneumonia, meningitis, an acute abdomen, etc. Recommend conservative treatment but quick  follow-up if she has high fever, severe abdominal pain, diarrhea with blood in the stools, a rash etc.

## 2015-03-10 NOTE — Progress Notes (Signed)
Pre visit review using our clinic review tool, if applicable. No additional management support is needed unless otherwise documented below in the visit note. 

## 2015-04-29 ENCOUNTER — Encounter: Payer: Self-pay | Admitting: Family

## 2015-04-29 ENCOUNTER — Ambulatory Visit (INDEPENDENT_AMBULATORY_CARE_PROVIDER_SITE_OTHER): Payer: 59 | Admitting: Family

## 2015-04-29 ENCOUNTER — Other Ambulatory Visit (INDEPENDENT_AMBULATORY_CARE_PROVIDER_SITE_OTHER): Payer: 59

## 2015-04-29 ENCOUNTER — Telehealth: Payer: Self-pay

## 2015-04-29 VITALS — BP 102/74 | HR 79 | Temp 97.6°F | Resp 18 | Ht 67.0 in | Wt 204.5 lb

## 2015-04-29 DIAGNOSIS — F32A Depression, unspecified: Secondary | ICD-10-CM

## 2015-04-29 DIAGNOSIS — Z0000 Encounter for general adult medical examination without abnormal findings: Secondary | ICD-10-CM

## 2015-04-29 DIAGNOSIS — F329 Major depressive disorder, single episode, unspecified: Secondary | ICD-10-CM | POA: Diagnosis not present

## 2015-04-29 DIAGNOSIS — F418 Other specified anxiety disorders: Secondary | ICD-10-CM

## 2015-04-29 LAB — BASIC METABOLIC PANEL
BUN: 18 mg/dL (ref 6–23)
CHLORIDE: 108 meq/L (ref 96–112)
CO2: 25 mEq/L (ref 19–32)
CREATININE: 0.92 mg/dL (ref 0.40–1.20)
Calcium: 9.5 mg/dL (ref 8.4–10.5)
GFR: 76.05 mL/min (ref >60.00)
Glucose, Bld: 88 mg/dL (ref 70–99)
Potassium: 4.6 mEq/L (ref 3.5–5.1)
Sodium: 141 mEq/L (ref 135–145)

## 2015-04-29 LAB — CBC
HCT: 45.4 % (ref 36.0–46.0)
Hemoglobin: 15.5 g/dL — ABNORMAL HIGH (ref 12.0–15.0)
MCHC: 34.1 g/dL (ref 30.0–36.0)
MCV: 91.1 fl (ref 78.0–100.0)
Platelets: 297 10*3/uL (ref 150.0–400.0)
RBC: 4.99 Mil/uL (ref 3.87–5.11)
RDW: 12.9 % (ref 11.5–15.5)
WBC: 6.6 10*3/uL (ref 4.0–10.5)

## 2015-04-29 LAB — LIPID PANEL
CHOL/HDL RATIO: 4
Cholesterol: 182 mg/dL (ref 0–200)
HDL: 49.7 mg/dL (ref >39.00)
LDL Cholesterol: 119 mg/dL — ABNORMAL HIGH (ref 0–99)
NonHDL: 132.3
Triglycerides: 65 mg/dL (ref 0.0–149.0)
VLDL: 13 mg/dL (ref 0.0–40.0)

## 2015-04-29 LAB — TSH: TSH: 1.54 u[IU]/mL (ref 0.35–4.50)

## 2015-04-29 MED ORDER — FLUOXETINE HCL 20 MG PO TABS: 20.0000 mg | ORAL_TABLET | Freq: Every day | ORAL | Status: DC

## 2015-04-29 MED ORDER — LORAZEPAM 0.5 MG PO TABS
ORAL_TABLET | ORAL | Status: DC
Start: 2015-04-29 — End: 2015-11-12

## 2015-04-29 MED ORDER — BUPROPION HCL ER (XL) 150 MG PO TB24: ORAL_TABLET | ORAL | Status: DC

## 2015-04-29 MED ORDER — LORAZEPAM 0.5 MG PO TABS: ORAL_TABLET | ORAL | Status: DC

## 2015-04-29 NOTE — Telephone Encounter (Signed)
Pharmacy wants to verify directions for wellbutrin. Is it ment to state for the pt to take 2 daily after 3 days of taking it once daily.

## 2015-04-29 NOTE — Assessment & Plan Note (Addendum)
Stable and currently maintained on Prozac. Takes her medication as prescribed and denies adverse side effects. Has noted inability to lose weight and questions related to Prozac which is a possibility. Decrease Prozac to wean off of medication and start Wellbutrin.

## 2015-04-29 NOTE — Patient Instructions (Signed)
Thank you for choosing [facility].  Summary/Instructions:  Your prescription(s) have been submitted to your pharmacy or been printed and provided for you. Please take as directed and contact our office if you believe you are having problem(s) with the medication(s) or have any questions.  If your symptoms worsen or fail to improve, please contact our office for further instruction, or in case of emergency go directly to the emergency room at the closest medical facility.   Please decrease your Prozac and start wellbutrin.  Use Ativan as needed for air travel.

## 2015-04-29 NOTE — Assessment & Plan Note (Signed)
Experiences anxiety with air travel. Start Ativan as needed to help manage symptoms. Follow up if medication is ineffective.

## 2015-04-29 NOTE — Progress Notes (Signed)
Pre visit review using our clinic review tool, if applicable. No additional management support is needed unless otherwise documented below in the visit note. 

## 2015-04-29 NOTE — Progress Notes (Signed)
Subjective:    Patient ID: Megan Dennis, female    DOB: 09-May-1986, 29 y.o.   MRN: [id number]  Chief Complaint  Patient presents with  . Medication follow up    wants to talk about her prozac and being prescribed another anxiety medication    HPI:  Megan Dennis is a 29 y.o. female with a PMH of TMJ, cervicalgia, and depression who presents today for a follow up office visit.    1.) Depression - Previously diagnosed with depression and is maintained on Prozac. Takes the medication as prescribed and denies any adverse side effects. Notes that she is trying to lose weight and questions if the Prozac is preventing her weight loss. Has been exercising daily and logging her foods.   Wt Readings from Last 3 Encounters:  04/29/15 204 lb 8 oz (92.761 kg)  03/10/15 208 lb 8 oz (94.575 kg)  12/08/14 209 lb (94.802 kg)    2.) Anxiety with Airline Travel - Expresses anxiety related to traveling on airplanes and is scheduled to go on a trip to [place] and is requesting antianxiety medication to assist with her travel. Has tried Xanax in the past which has not really helped.    No Known Allergies   Current Outpatient Prescriptions on File Prior to Visit  Medication Sig Dispense Refill  . cetirizine (ZYRTEC) 10 MG tablet Take 1 tablet (10 mg total) by mouth daily. 30 tablet 2  . fluticasone (FLONASE) 50 MCG/ACT nasal spray Place 1 spray into both nostrils daily. 16 g 2   No current facility-administered medications on file prior to visit.    Review of Systems  Constitutional:       Positive for weight gain.   Psychiatric/Behavioral: Negative for dysphoric mood.      Objective:    BP 102/74 mmHg  Pulse 79  Temp(Src) 97.6 F (36.4 C) (Oral)  Resp 18  Ht 5\' 7"  (1.702 m)  Wt 204 lb 8 oz (92.761 kg)  BMI 32.02 kg/m2  SpO2 98% Nursing note and vital signs reviewed.  Physical Exam  Constitutional: She is oriented to person, place, and time. She appears well-developed and  well-nourished. No distress.  Cardiovascular: Normal rate, regular rhythm, normal heart sounds and intact distal pulses.   Pulmonary/Chest: Effort normal and breath sounds normal.  Neurological: She is alert and oriented to person, place, and time.  Skin: Skin is warm and dry.  Psychiatric: She has a normal mood and affect. Her behavior is normal. Judgment and thought content normal.       Assessment & Plan:   Problem List Items Addressed This Visit      Other   Depression - Primary    Stable and currently maintained on Prozac. Takes her medication as prescribed and denies adverse side effects. Has noted inability to lose weight and questions related to Prozac which is a possibility. Decrease Prozac to wean off of medication and start Wellbutrin.       Relevant Medications   FLUoxetine (PROZAC) 20 MG tablet   buPROPion (WELLBUTRIN XL) 150 MG 24 hr tablet   LORazepam (ATIVAN) 0.5 MG tablet   Situational anxiety    Experiences anxiety with air travel. Start Ativan as needed to help manage symptoms. Follow up if medication is ineffective.       Relevant Medications   FLUoxetine (PROZAC) 20 MG tablet   buPROPion (WELLBUTRIN XL) 150 MG 24 hr tablet   LORazepam (ATIVAN) 0.5 MG tablet

## 2015-04-29 NOTE — Telephone Encounter (Signed)
Yes, that is correct. Start 1 pill daily for 3 days and then increase to 1 pill 2x per day.

## 2015-04-30 NOTE — Telephone Encounter (Signed)
Pharmacy aware

## 2015-05-27 ENCOUNTER — Encounter (HOSPITAL_COMMUNITY): Payer: Self-pay | Admitting: Emergency Medicine

## 2015-05-27 ENCOUNTER — Emergency Department (HOSPITAL_COMMUNITY)
Admission: EM | Admit: 2015-05-27 | Discharge: 2015-05-27 | Disposition: A | Discharge status: Home / Self Care | Payer: 59 | Source: Home / Self Care | Attending: Emergency Medicine | Admitting: Emergency Medicine

## 2015-05-27 DIAGNOSIS — E860 Dehydration: Secondary | ICD-10-CM | POA: Diagnosis not present

## 2015-05-27 DIAGNOSIS — R42 Dizziness and giddiness: Secondary | ICD-10-CM | POA: Diagnosis not present

## 2015-05-27 LAB — POCT I-STAT, CHEM 8
BUN: 17 mg/dL (ref 6–20)
Calcium, Ion: 1.15 mmol/L (ref 1.12–1.23)
Chloride: 104 mmol/L (ref 101–111)
Creatinine, Ser: 0.9 mg/dL (ref 0.44–1.00)
Glucose, Bld: 111 mg/dL — ABNORMAL HIGH (ref 65–99)
HEMATOCRIT: 51 % — AB (ref 36.0–46.0)
HEMOGLOBIN: 17.3 g/dL — AB (ref 12.0–15.0)
Potassium: 4.5 mmol/L (ref 3.5–5.1)
Sodium: 142 mmol/L (ref 135–145)
TCO2: 24 mmol/L (ref 0–100)

## 2015-05-27 LAB — POCT URINALYSIS DIP (DEVICE)
Bilirubin Urine: NEGATIVE
GLUCOSE, UA: NEGATIVE mg/dL
HGB URINE DIPSTICK: NEGATIVE
Ketones, ur: NEGATIVE mg/dL
Leukocytes, UA: NEGATIVE
Nitrite: NEGATIVE
PROTEIN: NEGATIVE mg/dL
Specific Gravity, Urine: >=1.03 (ref 1.005–1.030)
UROBILINOGEN UA: 0.2 mg/dL (ref 0.0–1.0)
pH: 5.5 (ref 5.0–8.0)

## 2015-05-27 LAB — POCT PREGNANCY, URINE: Preg Test, Ur: NEGATIVE

## 2015-05-27 MED ORDER — ONDANSETRON 4 MG PO TBDP
4.0000 mg | ORAL_TABLET | Freq: Three times a day (TID) | ORAL | Status: DC | PRN
Start: 2015-05-27 — End: 2015-11-12

## 2015-05-27 NOTE — ED Provider Notes (Signed)
CSN: [id number]     Arrival date & time 05/27/15  1705 History   First MD Initiated Contact with Patient 05/27/15 1752     Chief Complaint  Patient presents with  . Dizziness  . Nausea   (Consider location/radiation/quality/duration/timing/severity/associated sxs/prior Treatment) HPI She is a 29 year old woman here for evaluation of dizziness. She states for the last 3 days she has felt "gross." She describes this as getting dizzy and lightheaded, particularly when she stands up. This is associated with some nausea and feeling a heaviness in her stomach. She denies any room spinning sensation. Her symptoms come in waves. She has not been drinking fluids very well. She states that over the weekend she drank a lot of alcohol and had some resultant vomiting. She also did a very intense workout on Monday.  Past Medical History  Diagnosis Date  . Depression   . Migraines   . Asthma     Exercised Induced, and as a child   Past Surgical History  Procedure Laterality Date  . Lipoma removal  1992  . Tonsillectomy  2000  . Widsom teeth  2002   Family History  Problem Relation Age of Onset  . Hyperlipidemia Maternal Grandmother   . Dementia Paternal Grandfather    Social History  Substance Use Topics  . Smoking status: Never Smoker   . Smokeless tobacco: Never Used  . Alcohol Use: 0.0 oz/week    0 Standard drinks or equivalent per week     Comment: rarely   OB History    No data available     Review of Systems As in history of present illness Allergies  Review of patient's allergies indicates no known allergies.  Home Medications   Prior to Admission medications   Medication Sig Start Date End Date Taking? Authorizing Provider  buPROPion (WELLBUTRIN XL) 150 MG 24 hr tablet Take 1 tablet daily for 3 days and then 1 tablet twice (2) daily. 04/29/15   Golden Circle, FNP  cetirizine (ZYRTEC) 10 MG tablet Take 1 tablet (10 mg total) by mouth daily. 11/22/14   Melony Overly, MD   FLUoxetine (PROZAC) 20 MG tablet Take 1 tablet (20 mg total) by mouth daily. 04/29/15   Golden Circle, FNP  fluticasone (FLONASE) 50 MCG/ACT nasal spray Place 1 spray into both nostrils daily. 11/22/14   Melony Overly, MD  LORazepam (ATIVAN) 0.5 MG tablet Take 1-3 tablets as needed for anxiety daily 04/29/15   Golden Circle, FNP  ondansetron (ZOFRAN ODT) 4 MG disintegrating tablet Take 1 tablet (4 mg total) by mouth every 8 (eight) hours as needed for nausea or vomiting. 05/27/15   Melony Overly, MD   LMP    Orthostatic Lying  - BP- Lying: 118/80 mmHg ; Pulse- Lying: 89  Orthostatic Sitting - BP- Sitting: 121/84 mmHg ; Pulse- Sitting: 88  Orthostatic Standing at 0 minutes - BP- Standing at 0 minutes: 115/80 mmHg ; Pulse- Standing at 0 minutes: 110 Physical Exam  Constitutional: She is oriented to person, place, and time. She appears well-developed and well-nourished. No distress.  Cardiovascular: Normal rate, regular rhythm and normal heart sounds.   No murmur heard. Pulmonary/Chest: Effort normal and breath sounds normal. No respiratory distress. She has no wheezes. She has no rales.  Neurological: She is alert and oriented to person, place, and time.  Vitals reviewed.   ED Course  Procedures (including critical care time) Labs Review Labs Reviewed  POCT I-STAT, CHEM 8 - Abnormal;  Notable for the following:    Glucose, Bld 111 (*)    Hemoglobin 17.3 (*)    HCT 51.0 (*)    All other components within normal limits  POCT URINALYSIS DIP (DEVICE)  POCT PREGNANCY, URINE    Imaging Review No results found.   MDM   1. Dehydration   2. Orthostatic dizziness    Her urine is quite concentrated. Her symptoms are likely due to dehydration. Orthostatic vitals are positive with a jump in heart rate. Discussed increased fluid intake over the next several days. Prescription for Zofran given to be used if needed. Return precautions reviewed.    Melony Overly, MD 05/27/15 [phone]

## 2015-05-27 NOTE — Discharge Instructions (Signed)

## 2015-05-27 NOTE — ED Notes (Signed)
Pt states that she has had dizziness and nausea for the past 3 to 4 days

## 2015-06-04 ENCOUNTER — Telehealth: Payer: Self-pay | Admitting: Family

## 2015-06-04 NOTE — Telephone Encounter (Signed)
Pt requesting refill for buPROPion (WELLBUTRIN XL) 150 MG 24 hr tablet [[id number]]  Pharmacy is [facility]

## 2015-06-04 NOTE — Telephone Encounter (Signed)
LVM for pt to call back. Pt had refill sent in on 04/29/15 with 1 refill.

## 2015-06-04 NOTE — Telephone Encounter (Signed)
Spoke with pt, instructed her that the pharmacy should have 1 refill on file, to call the pharm for the refill.

## 2015-06-23 ENCOUNTER — Other Ambulatory Visit: Payer: Self-pay | Admitting: Family

## 2015-08-05 ENCOUNTER — Encounter: Payer: Self-pay | Admitting: Family

## 2015-08-05 DIAGNOSIS — F329 Major depressive disorder, single episode, unspecified: Secondary | ICD-10-CM

## 2015-08-05 DIAGNOSIS — F32A Depression, unspecified: Secondary | ICD-10-CM

## 2015-08-06 MED ORDER — FLUOXETINE HCL 20 MG PO TABS: 20.0000 mg | ORAL_TABLET | Freq: Every day | ORAL | Status: DC

## 2015-09-15 ENCOUNTER — Other Ambulatory Visit: Payer: Self-pay

## 2015-09-15 DIAGNOSIS — F32A Depression, unspecified: Secondary | ICD-10-CM

## 2015-09-15 DIAGNOSIS — F329 Major depressive disorder, single episode, unspecified: Secondary | ICD-10-CM

## 2015-09-15 MED ORDER — FLUOXETINE HCL 20 MG PO TABS: 20.0000 mg | ORAL_TABLET | Freq: Every day | ORAL | Status: DC

## 2015-09-17 ENCOUNTER — Encounter (HOSPITAL_COMMUNITY): Payer: Self-pay | Admitting: Emergency Medicine

## 2015-09-17 ENCOUNTER — Emergency Department (HOSPITAL_COMMUNITY)
Admission: EM | Admit: 2015-09-17 | Discharge: 2015-09-17 | Disposition: A | Discharge status: Home / Self Care | Payer: 59 | Source: Home / Self Care | Attending: Family Medicine | Admitting: Family Medicine

## 2015-09-17 DIAGNOSIS — H9203 Otalgia, bilateral: Secondary | ICD-10-CM

## 2015-09-17 MED ORDER — AMOXICILLIN 500 MG PO CAPS: 500.0000 mg | ORAL_CAPSULE | Freq: Three times a day (TID) | ORAL | Status: DC

## 2015-09-17 NOTE — Discharge Instructions (Signed)

## 2015-09-17 NOTE — ED Notes (Signed)
Pt. here with progressive bilateral ear pain Started last week with itching, denies cold sx's  Pain radiating to jaw, no fever or dizziness noted

## 2015-09-17 NOTE — ED Provider Notes (Signed)
CSN: [id number]     Arrival date & time 09/17/15  1458 History   First MD Initiated Contact with Patient 09/17/15 1513     Chief Complaint  Patient presents with  . Otalgia   (Consider location/radiation/quality/duration/timing/severity/associated sxs/prior Treatment) Patient is a 29 y.o. female presenting with ear pain. The history is provided by the patient. No language interpreter was used.  Otalgia Location:  Bilateral Behind ear:  No abnormality Quality:  Aching Severity:  Moderate Onset quality:  Gradual Duration:  1 week Timing:  Constant Progression:  Worsening Chronicity:  New Relieved by:  Nothing Worsened by:  Nothing tried Ineffective treatments:  None tried Associated symptoms: no rhinorrhea and no sore throat   Risk factors: no chronic ear infection and no prior ear surgery     Past Medical History  Diagnosis Date  . Depression   . Migraines   . Asthma     Exercised Induced, and as a child   Past Surgical History  Procedure Laterality Date  . Lipoma removal  1992  . Tonsillectomy  2000  . Widsom teeth  2002   Family History  Problem Relation Age of Onset  . Hyperlipidemia Maternal Grandmother   . Dementia Paternal Grandfather    Social History  Substance Use Topics  . Smoking status: Never Smoker   . Smokeless tobacco: Never Used  . Alcohol Use: 0.0 oz/week    0 Standard drinks or equivalent per week     Comment: rarely   OB History    No data available     Review of Systems  HENT: Positive for ear pain. Negative for rhinorrhea and sore throat.   All other systems reviewed and are negative.   Allergies  Review of patient's allergies indicates no known allergies.  Home Medications   Prior to Admission medications   Medication Sig Start Date End Date Taking? Authorizing Provider  amoxicillin (AMOXIL) 500 MG capsule Take 1 capsule (500 mg total) by mouth 3 (three) times daily. 09/17/15   Fransico Meadow, PA-C  buPROPion (WELLBUTRIN XL) 150  MG 24 hr tablet TAKE 1 TABLET BY MOUTH DAILY FOR 3 DAYS, THEN 1 TABLET 2 TIMES A DAILY. 06/23/15   Golden Circle, FNP  cetirizine (ZYRTEC) 10 MG tablet Take 1 tablet (10 mg total) by mouth daily. 11/22/14   Melony Overly, MD  FLUoxetine (PROZAC) 20 MG tablet Take 1 tablet (20 mg total) by mouth daily. 09/15/15   Golden Circle, FNP  fluticasone (FLONASE) 50 MCG/ACT nasal spray Place 1 spray into both nostrils daily. 11/22/14   Melony Overly, MD  LORazepam (ATIVAN) 0.5 MG tablet Take 1-3 tablets as needed for anxiety daily 04/29/15   Golden Circle, FNP  ondansetron (ZOFRAN ODT) 4 MG disintegrating tablet Take 1 tablet (4 mg total) by mouth every 8 (eight) hours as needed for nausea or vomiting. 05/27/15   Melony Overly, MD   Meds Ordered and Administered this Visit  Medications - No data to display  BP 121/79 mmHg  Pulse 89  Temp(Src) 98.9 F (37.2 C) (Oral)  Resp 16  SpO2 98% No data found.   Physical Exam  Constitutional: She is oriented to person, place, and time. She appears well-developed and well-nourished.  HENT:  Head: Normocephalic and atraumatic.  Mouth/Throat: Oropharynx is clear and moist.  Right tm injected, slightly retracted, left tm decreased landmarks,   Eyes: Conjunctivae and EOM are normal. Pupils are equal, round, and reactive to light.  Neck: Normal range of motion.  Cardiovascular: Normal rate.   Pulmonary/Chest: Effort normal.  Abdominal: She exhibits no distension.  Musculoskeletal: Normal range of motion.  Neurological: She is alert and oriented to person, place, and time.  Psychiatric: She has a normal mood and affect.  Nursing note and vitals reviewed.   ED Course  Procedures (including critical care time)  Labs Review Labs Reviewed - No data to display  Imaging Review No results found.   Visual Acuity Review  Right Eye Distance:   Left Eye Distance:   Bilateral Distance:    Right Eye Near:   Left Eye Near:    Bilateral Near:          MDM  Pt may have a viral illness, I advised try afrin, decongestants.     1. Otalgia, bilateral    Meds ordered this encounter  Medications  . amoxicillin (AMOXIL) 500 MG capsule    Sig: Take 1 capsule (500 mg total) by mouth 3 (three) times daily.    Dispense:  21 capsule    Refill:  0    Order Specific Question:  Supervising Provider    Answer:  Ihor Gully D [place], PA-C 09/17/15 [place], [place] 09/17/15 1722

## 2015-11-10 DIAGNOSIS — L299 Pruritus, unspecified: Secondary | ICD-10-CM | POA: Diagnosis not present

## 2015-11-10 DIAGNOSIS — H60542 Acute eczematoid otitis externa, left ear: Secondary | ICD-10-CM | POA: Diagnosis not present

## 2015-11-10 DIAGNOSIS — H9203 Otalgia, bilateral: Secondary | ICD-10-CM | POA: Diagnosis not present

## 2015-11-10 MED FILL — MOMETASONE FUROATE 0.1% SOL: 0.1 | 30 days supply | Qty: 30 | Fill #0

## 2015-11-12 ENCOUNTER — Other Ambulatory Visit (INDEPENDENT_AMBULATORY_CARE_PROVIDER_SITE_OTHER): Payer: 59

## 2015-11-12 ENCOUNTER — Encounter: Payer: Self-pay | Admitting: Family Medicine

## 2015-11-12 ENCOUNTER — Ambulatory Visit (INDEPENDENT_AMBULATORY_CARE_PROVIDER_SITE_OTHER): Payer: 59 | Admitting: Family Medicine

## 2015-11-12 VITALS — BP 132/80 | HR 97 | Wt 212.0 lb

## 2015-11-12 DIAGNOSIS — M216X2 Other acquired deformities of left foot: Secondary | ICD-10-CM | POA: Diagnosis not present

## 2015-11-12 DIAGNOSIS — M25579 Pain in unspecified ankle and joints of unspecified foot: Secondary | ICD-10-CM

## 2015-11-12 DIAGNOSIS — M7742 Metatarsalgia, left foot: Secondary | ICD-10-CM

## 2015-11-12 NOTE — Progress Notes (Signed)
[facility] [place],  [address] Phone: [phone] Subjective:      CC: Left foot pain  [id number] Megan Dennis is a 30 y.o. female coming in with complaint of left foot pain. Patient states that she was attempting in trying to start lose weight over the course last 3 weeks and start running. Had pain immediately. Seems to be more on the plantar aspect of the foot. Worse with activity. Started giving her some trouble even with her regular work. States that it sometimes radiated towards her toes. Denies any numbness with it. Rates the severity of pain a 6 out of 10. Patient was on the elliptical the other day and did not have any pain with it. Did respond to anti-inflammatories.     Past Medical History  Diagnosis Date  . Depression   . Migraines   . Asthma     Exercised Induced, and as a child   Past Surgical History  Procedure Laterality Date  . Lipoma removal  1992  . Tonsillectomy  2000  . Widsom teeth  2002   Social History   Social History  . Marital Status: Single    Spouse Name: N/A  . Number of Children: 0  . Years of Education: 18   Occupational History  . 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 Smoking status: Never Smoker   . Smokeless tobacco: Never Used  . Alcohol Use: 0.0 oz/week    0 Standard drinks or equivalent per week     Comment: rarely  . Drug Use: No  . Sexual Activity: Yes    Birth Control/ Protection: None   Other Topics Concern  . None   Social History Narrative   Born and raised in [place]. 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 Fun: Sleep when not working.    Denies religious beliefs effecting health care.    No Known Allergies Family History  Problem Relation Age of Onset  . Hyperlipidemia Maternal Grandmother   . Dementia Paternal Grandfather     Past medical history, social, surgical and family history all reviewed in electronic medical record.   No pertanent information unless stated regarding to the chief complaint.   Review of Systems: No headache, visual changes, nausea, vomiting, diarrhea, constipation, dizziness, abdominal pain, skin rash, fevers, chills, night sweats, weight loss, swollen lymph nodes, body aches, joint swelling, muscle aches, chest pain, shortness of breath, mood changes.   Objective Blood pressure 132/80, pulse 97, weight 212 lb (96.163 kg), SpO2 98 %.  General: No apparent distress alert and oriented x3 mood and affect normal, dressed appropriately.  HEENT: Pupils equal, extraocular movements intact  Respiratory: Patient's speak in full sentences and does not appear short of breath  Cardiovascular: No lower extremity edema, non tender, no erythema  Skin: Warm dry intact with no signs of infection or rash on extremities or on axial skeleton.  Abdomen: Soft nontender  Neuro: Cranial nerves II through XII are intact, neurovascularly intact in all extremities with 2+ DTRs and 2+ pulses.  Lymph: No lymphadenopathy of posterior or anterior cervical chain or axillae bilaterally.  Gait normal with good balance and coordination.  MSK:  Non tender with full range of motion and good stability and symmetric strength and tone of shoulders, elbows, wrist, hip, knee and ankles bilaterally.  Foot exam shows patient does have mild breakdown of the transverse arch bilaterally left greater than right. Patient is  tender to palpation over the metatarsal heads of second through third's. No dorsal pain. Full range of motion of the ankle. Achilles intact. No abnormality with gait. Neurovascular intact distally.  Limited musculoskeletal ultrasound was performed and interpreted by Hulan Saas, M  Limited ultrasound the patient's foot did not show any bony abnormality. No cortical defect noted. Patient does have a very small Morton's neuroma between the second and third metatarsal space. Otherwise fairly unremarkable. Plantar fasciitis  normal Impression: Relatively normal foot ultrasound.   Impression and Recommendations:     This case required medical decision making of moderate complexity.      Note: This dictation was prepared with Dragon dictation along with smaller phrase technology. Any transcriptional errors that result from this process are unintentional.

## 2015-11-12 NOTE — Assessment & Plan Note (Signed)
Patient does have more of a metatarsalgia. I think is secondary to the breakdown of the transverse arch and patient having some overweight. We discussed with patient about home exercises, we discussed proper shoe wear, over-the-counter orthotics. Patient will try icing. Given trial topical anti-inflammatory. Patient will try these different interventions and come back and see me again in 3-4 weeks. If worsening symptoms she will give [place] a call. Patient is to bring shoes and we will do a gait analysis as well.

## 2015-11-12 NOTE — Patient Instructions (Addendum)
Good to see you  Ice bath at night Spenco orthotic "total support" would be good. Look online Good shoes with rigid bottom.  Megan Dennis, [place] or New balance greater then [employer] would be good running shoes.  Avoid being barefoot.  Vitamin D 2000 IU daily Exercises 3 times a week.  Only elliptical and biking would be best  See me again in 3 weeks and bring your shoes and lets see you "run"

## 2015-11-27 ENCOUNTER — Other Ambulatory Visit: Payer: Self-pay | Admitting: Family

## 2015-11-27 ENCOUNTER — Telehealth: Payer: Self-pay | Admitting: Family

## 2015-11-27 MED ORDER — MELOXICAM 15 MG PO TABS: 15.0000 mg | ORAL_TABLET | Freq: Every day | ORAL | Status: DC

## 2015-11-27 MED FILL — FLUoxetine HCL 20 MG CAPS: 20 | 30 days supply | Qty: 30 | Fill #0

## 2015-11-27 MED FILL — MELOXICAM 15 MG TABLET: 15 | 30 days supply | Qty: 30 | Fill #0

## 2015-11-27 NOTE — Telephone Encounter (Signed)
Called patient back. Patient is having worsening pain. Seems to be on the lateral aspect the foot. Discuss that I want her to stop running or any jumping activities. Meloxicam sent in. Coming in next week

## 2015-11-27 NOTE — Telephone Encounter (Signed)
Pt called in stating the pain in her foot is worse than it was before. Wanting to know if there is anything you can do

## 2015-12-03 ENCOUNTER — Other Ambulatory Visit (INDEPENDENT_AMBULATORY_CARE_PROVIDER_SITE_OTHER): Payer: 59

## 2015-12-03 ENCOUNTER — Encounter: Payer: Self-pay | Admitting: Family Medicine

## 2015-12-03 ENCOUNTER — Ambulatory Visit (INDEPENDENT_AMBULATORY_CARE_PROVIDER_SITE_OTHER)
Admission: RE | Admit: 2015-12-03 | Discharge: 2015-12-03 | Disposition: A | Discharge status: Home / Self Care | Payer: 59 | Source: Ambulatory Visit | Attending: Family Medicine | Admitting: Family Medicine

## 2015-12-03 ENCOUNTER — Ambulatory Visit (INDEPENDENT_AMBULATORY_CARE_PROVIDER_SITE_OTHER): Payer: 59 | Admitting: Family Medicine

## 2015-12-03 VITALS — BP 126/82 | HR 72 | Ht 67.0 in | Wt 215.0 lb

## 2015-12-03 DIAGNOSIS — M84375A Stress fracture, left foot, initial encounter for fracture: Secondary | ICD-10-CM

## 2015-12-03 DIAGNOSIS — M79672 Pain in left foot: Secondary | ICD-10-CM

## 2015-12-03 DIAGNOSIS — M84376A Stress fracture, unspecified foot, initial encounter for fracture: Secondary | ICD-10-CM | POA: Insufficient documentation

## 2015-12-03 MED ORDER — VITAMIN D (ERGOCALCIFEROL) 1.25 MG (50000 UNIT) PO CAPS: 50000.0000 [IU] | ORAL_CAPSULE | ORAL | Status: DC

## 2015-12-03 NOTE — Assessment & Plan Note (Signed)
Patient does have a stress reaction of the fifth metatarsal. Patient was put in a postop today. We discussed which activities to avoid. Patient will do some low impact exercises. We discussed the possibility of prednisone and patient declined. We discussed vitamin D supplementation. Think this will aid in healing. Patient will come back and see me again in 3 weeks to make sure you're responding. Ultrasound will be done at that time.

## 2015-12-03 NOTE — Patient Instructions (Signed)
Good to see you  Boot at work for the next 3 weeks as much as you can  You can do elliptical and biking Ice still as much as you can Heel donut from [facility] in the shoe can help the heel See me again in 3 weeks or sen me a message if you don't notice any change in 2 weeks.  Sorry :(

## 2015-12-03 NOTE — Progress Notes (Signed)
Pre visit review using our clinic review tool, if applicable. No additional management support is needed unless otherwise documented below in the visit note. 

## 2015-12-03 NOTE — Progress Notes (Signed)
[facility] [address] [place], [place] [address] Phone: [phone] Subjective:      CC: Left foot pain follow-up  [id number] Megan Dennis is a 30 y.o. female coming in with complaint of left foot pain. Patient was having pain over the lateral aspect of the foot as well as a little bit on her heel. Started having increasing discomfort even with patient avoiding running for short course of time. Patient states that he hurt more on the outside of the foot. No swelling.  X-rays ordered and reviewed by me today. Independently visualized. Patient was found to have a stress fracture of the fifth metatarsal. Seems to have callus formation. Otherwise fairly unremarkable.     Past Medical History  Diagnosis Date  . Depression   . Migraines   . Asthma     Exercised Induced, and as a child   Past Surgical History  Procedure Laterality Date  . Lipoma removal  1992  . Tonsillectomy  2000  . Widsom teeth  2002   Social History   Social History  . Marital Status: Single    Spouse Name: N/A  . Number of Children: 0  . Years of Education: 18   Occupational History  . 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 Smoking status: Never Smoker   . Smokeless tobacco: Never Used  . Alcohol Use: 0.0 oz/week    0 Standard drinks or equivalent per week     Comment: rarely  . Drug Use: No  . Sexual Activity: Yes    Birth Control/ Protection: None   Other Topics Concern  . None   Social History Narrative   Born and raised in [place]. 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 Fun: Sleep when not working.    Denies religious beliefs effecting health care.    No Known Allergies Family History  Problem Relation Age of Onset  . Hyperlipidemia Maternal Grandmother   . Dementia Paternal Grandfather     Past medical history, social, surgical and family history all reviewed in electronic medical record.  No pertanent information unless  stated regarding to the chief complaint.   Review of Systems: No headache, visual changes, nausea, vomiting, diarrhea, constipation, dizziness, abdominal pain, skin rash, fevers, chills, night sweats, weight loss, swollen lymph nodes, body aches, joint swelling, muscle aches, chest pain, shortness of breath, mood changes.   Objective Blood pressure 126/82, pulse 72, height 5\' 7"  (1.702 m), weight 215 lb (97.523 kg), last menstrual period 11/19/2015, SpO2 98 %.  General: No apparent distress alert and oriented x3 mood and affect normal, dressed appropriately.  HEENT: Pupils equal, extraocular movements intact  Respiratory: Patient's speak in full sentences and does not appear short of breath  Cardiovascular: No lower extremity edema, non tender, no erythema  Skin: Warm dry intact with no signs of infection or rash on extremities or on axial skeleton.  Abdomen: Soft nontender  Neuro: Cranial nerves II through XII are intact, neurovascularly intact in all extremities with 2+ DTRs and 2+ pulses.  Lymph: No lymphadenopathy of posterior or anterior cervical chain or axillae bilaterally.  Gait normal with good balance and coordination.  MSK:  Non tender with full range of motion and good stability and symmetric strength and tone of shoulders, elbows, wrist, hip, knee and ankles bilaterally.  Foot exam shows patient does have mild breakdown of the transverse arch bilaterally left greater than right. Positive squeeze test of the  foot. Patient is tender over the fifth metatarsal proximally as well. Neurovascular intact distally. Patient is very tender to palpation on the heel as well.    Impression and Recommendations:     This case required medical decision making of moderate complexity.      Note: This dictation was prepared with Dragon dictation along with smaller phrase technology. Any transcriptional errors that result from this process are unintentional.

## 2015-12-17 DIAGNOSIS — Z1151 Encounter for screening for human papillomavirus (HPV): Secondary | ICD-10-CM | POA: Diagnosis not present

## 2015-12-17 DIAGNOSIS — Z01419 Encounter for gynecological examination (general) (routine) without abnormal findings: Secondary | ICD-10-CM | POA: Diagnosis not present

## 2015-12-24 ENCOUNTER — Encounter: Payer: Self-pay | Admitting: Family Medicine

## 2015-12-24 ENCOUNTER — Other Ambulatory Visit (INDEPENDENT_AMBULATORY_CARE_PROVIDER_SITE_OTHER): Payer: 59

## 2015-12-24 ENCOUNTER — Ambulatory Visit (INDEPENDENT_AMBULATORY_CARE_PROVIDER_SITE_OTHER): Payer: 59 | Admitting: Family Medicine

## 2015-12-24 VITALS — BP 108/74 | HR 82 | Ht 67.0 in | Wt 215.0 lb

## 2015-12-24 DIAGNOSIS — M84375D Stress fracture, left foot, subsequent encounter for fracture with routine healing: Secondary | ICD-10-CM | POA: Diagnosis not present

## 2015-12-24 DIAGNOSIS — D2120 Benign neoplasm of connective and other soft tissue of unspecified lower limb, including hip: Secondary | ICD-10-CM | POA: Insufficient documentation

## 2015-12-24 DIAGNOSIS — M79672 Pain in left foot: Secondary | ICD-10-CM

## 2015-12-24 DIAGNOSIS — D2122 Benign neoplasm of connective and other soft tissue of left lower limb, including hip: Secondary | ICD-10-CM | POA: Diagnosis not present

## 2015-12-24 NOTE — Progress Notes (Signed)
[name] [place], [place] [address] Phone: [phone] Subjective:      CC: Left foot pain follow-up  [id number] Megan Dennis is a 30 y.o. female coming in with complaint of left foot pain. Patient was having pain over the lateral aspect of the foot as well as a little bit on her heel. Patient was found to have what appeared to be a stress fracture of the fifth metatarsal. Patient was put in a postop boot. States that the fifth metatarsal pain seems to be better but continues to have a heel pain. Patient states that this is severe. Feels like she is walking on rocks. States another what shoes she is in or barefoot aches seems to hurt her. Does not seem to be getting significantly better.      Past Medical History  Diagnosis Date  . Depression   . Migraines   . Asthma     Exercised Induced, and as a child   Past Surgical History  Procedure Laterality Date  . Lipoma removal  1992  . Tonsillectomy  2000  . Widsom teeth  2002   Social History   Social History  . Marital Status: Single    Spouse Name: N/A  . Number of Children: 0  . Years of Education: 18   Occupational History  . 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 Smoking status: Never Smoker   . Smokeless tobacco: Never Used  . Alcohol Use: 0.0 oz/week    0 Standard drinks or equivalent per week     Comment: rarely  . Drug Use: No  . Sexual Activity: Yes    Birth Control/ Protection: None   Other Topics Concern  . None   Social History Narrative   Born and raised in [place]. 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 Fun: Sleep when not working.    Denies religious beliefs effecting health care.    No Known Allergies Family History  Problem Relation Age of Onset  . Hyperlipidemia Maternal Grandmother   . Dementia Paternal Grandfather     Past medical history, social, surgical and family history all reviewed in electronic medical  record.  No pertanent information unless stated regarding to the chief complaint.   Review of Systems: No headache, visual changes, nausea, vomiting, diarrhea, constipation, dizziness, abdominal pain, skin rash, fevers, chills, night sweats, weight loss, swollen lymph nodes, body aches, joint swelling, muscle aches, chest pain, shortness of breath, mood changes.   Objective Blood pressure 108/74, pulse 82, height 5\' 7"  (1.702 m), weight 215 lb (97.523 kg), last menstrual period 11/19/2015, SpO2 96 %.  General: No apparent distress alert and oriented x3 mood and affect normal, dressed appropriately.  HEENT: Pupils equal, extraocular movements intact  Respiratory: Patient's speak in full sentences and does not appear short of breath  Cardiovascular: No lower extremity edema, non tender, no erythema  Skin: Warm dry intact with no signs of infection or rash on extremities or on axial skeleton.  Abdomen: Soft nontender  Neuro: Cranial nerves II through XII are intact, neurovascularly intact in all extremities with 2+ DTRs and 2+ pulses.  Lymph: No lymphadenopathy of posterior or anterior cervical chain or axillae bilaterally.  Gait normal with good balance and coordination.  MSK:  Non tender with full range of motion and good stability and symmetric strength and tone of shoulders, elbows, wrist, hip, knee and ankles bilaterally.  Foot exam shows patient  does have mild breakdown of the transverse arch bilaterally left greater than right. Negative squeeze test. Patient is still uncomfortable over the fifth metatarsal but not as severe as he was previously. Patient is severely tender over the medial plantar aspect of the heel still. No pain over the Achilles.  Limited musculoskeletal ultrasound of foot shows Patient's fifth metatarsal stress fracture hasn't nearly completely healed. Good callus formation with mild hypoechoic changes still remaining. Patient is tender over her heel on the medial plantar  aspect of the heel there appears to be a large heterotropic mass. Possible fibroma noted. Does not seem to be within the plantar fascia. No increasing Doppler flow hypoechoic changes. No vascularization. Impression: Healing stress fracture with questionable fibroma of the proximal plantar fascia medially.   Procedure: Real-time Ultrasound Guided Injection of fibroma of the medial plantar aspect of the foot. Device: GE Logiq E  Ultrasound guided injection is preferred based studies that show increased duration, increased effect, greater accuracy, decreased procedural pain, increased response rate, and decreased cost with ultrasound guided versus blind injection.  Verbal informed consent obtained.  Time-out conducted.  Noted no overlying erythema, induration, or other signs of local infection.  Skin prepped in a sterile fashion.  Local anesthesia: Topical Ethyl chloride.  With sterile technique and under real time ultrasound guidance:  The 25-gauge 1 inch needle patient was injected with a total of 1 mL of 0.5% Marcaine and 1 mL of Kenalog 41 g/dL. This was done and a medial approach to the fibroid. Completed without difficulty  Pain immediately resolved suggesting accurate placement of the medication.  Advised to call if fevers/chills, erythema, induration, drainage, or persistent bleeding.  Images permanently stored and available for review in the ultrasound unit.  Impression: Technically successful ultrasound guided injection.    Impression and Recommendations:     This case required medical decision making of moderate complexity.      Note: This dictation was prepared with Dragon dictation along with smaller phrase technology. Any transcriptional errors that result from this process are unintentional.

## 2015-12-24 NOTE — Assessment & Plan Note (Signed)
Patient given injection today and tolerated the procedure well. We discussed icing regimen and home exercises. We discussed proper shoes and likely will need to maximize cushioning of the foot. Possible custom orthotics may be necessary. We discussed the prognosis of this. Patient will come back in 3 weeks for further evaluation.

## 2015-12-24 NOTE — Patient Instructions (Signed)
We injected today and hopefully it helps Likely a fibroma and may be a little bit of a concern  Ice it in 6 hours.  Continue to avoid being barefoot.  We may need to do custom orthotics at a later date See me again in 3 weeks.

## 2015-12-24 NOTE — Assessment & Plan Note (Signed)
Seems to be healing overall. Discussed icing regimen. Discuss continuing the vitamin D supplementation. Patient and will continue these medications and come back and see me again in 3 weeks.

## 2015-12-24 NOTE — Progress Notes (Signed)
Pre visit review using our clinic review tool, if applicable. No additional management support is needed unless otherwise documented below in the visit note. 

## 2016-02-16 MED FILL — FLUoxetine HCL 20 MG CAPS: 20 | 30 days supply | Qty: 30 | Fill #1

## 2016-03-31 MED FILL — FLUoxetine HCL 20 MG CAPS: 20 | 30 days supply | Qty: 30 | Fill #2

## 2016-05-17 ENCOUNTER — Ambulatory Visit (INDEPENDENT_AMBULATORY_CARE_PROVIDER_SITE_OTHER): Payer: 59 | Admitting: Licensed Clinical Social Worker

## 2016-05-17 DIAGNOSIS — F324 Major depressive disorder, single episode, in partial remission: Secondary | ICD-10-CM | POA: Diagnosis not present

## 2016-05-18 MED FILL — FLUoxetine HCL 20 MG CAPS: 20 | 30 days supply | Qty: 30 | Fill #3

## 2016-05-30 ENCOUNTER — Ambulatory Visit: Payer: 59 | Admitting: Licensed Clinical Social Worker

## 2016-07-06 DIAGNOSIS — J069 Acute upper respiratory infection, unspecified: Secondary | ICD-10-CM | POA: Diagnosis not present

## 2016-07-07 ENCOUNTER — Ambulatory Visit: Payer: Self-pay

## 2016-07-07 NOTE — Progress Notes (Signed)
[facility] [place], [place] [address] Phone: [phone] Subjective:      CC: Left foot pain follow-up  [id number]  Megan Dennis is a 30 y.o. female coming in with complaint of left foot pain. Patient was having pain over the lateral aspect of the foot as well as a little bit on her heel. Patient had a stress fracture at one point but seemed to resolve. Patient also had a fibroma. Was given an injection over 6 months ago. Patient states Was doing very well until the last couple weeks. Patient is going out of the country for 2 weeks. Patient will be doing a lot of walking. Concerned because the pain is starting to increase already.      Past Medical History:  Diagnosis Date  . Asthma    Exercised Induced, and as a child  . Depression   . Migraines    Past Surgical History:  Procedure Laterality Date  . lipoma removal  1992  . TONSILLECTOMY  2000  . widsom teeth  2002   Social History   Social History  . Marital status: Single    Spouse name: N/A  . Number of children: 0  . Years of education: 37   Occupational History  . 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 Smoking status: Never Smoker  . Smokeless tobacco: Never Used  . Alcohol use 0.0 oz/week     Comment: rarely  . Drug use: No  . Sexual activity: Yes    Birth control/ protection: None   Other Topics Concern  . Not on file   Social History Narrative   Born and raised in [place]. 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 Fun: Sleep when not working.    Denies religious beliefs effecting health care.    No Known Allergies Family History  Problem Relation Age of Onset  . Hyperlipidemia Maternal Grandmother   . Dementia Paternal Grandfather     Past medical history, social, surgical and family history all reviewed in electronic medical record.  No pertanent information unless stated regarding to the chief complaint.   Review of  Systems: No headache, visual changes, nausea, vomiting, diarrhea, constipation, dizziness, abdominal pain, skin rash, fevers, chills, night sweats, weight loss, swollen lymph nodes, body aches, joint swelling, muscle aches, chest pain, shortness of breath, mood changes.   Objective  There were no vitals taken for this visit.  General: No apparent distress alert and oriented x3 mood and affect normal, dressed appropriately.  HEENT: Pupils equal, extraocular movements intact  Respiratory: Patient's speak in full sentences and does not appear short of breath  Cardiovascular: No lower extremity edema, non tender, no erythema  Skin: Warm dry intact with no signs of infection or rash on extremities or on axial skeleton.  Abdomen: Soft nontender  Neuro: Cranial nerves II through XII are intact, neurovascularly intact in all extremities with 2+ DTRs and 2+ pulses.  Lymph: No lymphadenopathy of posterior or anterior cervical chain or axillae bilaterally.  Gait Mild antalgic gait MSK:  Non tender with full range of motion and good stability and symmetric strength and tone of shoulders, elbows, wrist, hip, knee and ankles bilaterally.  Foot exam shows patient does have mild breakdown of the transverse arch bilaterally left greater than right. Negative squeeze test.  Moderate tenderness over the medial plantar aspect of the heel. No pain over the Achilles. Full range of motion and  neurovascular intact distally.    Procedure: Real-time Ultrasound Guided Injection of fibroma of the medial plantar aspect of the foot fibroma Device: GE Logiq E  Ultrasound guided injection is preferred based studies that show increased duration, increased effect, greater accuracy, decreased procedural pain, increased response rate, and decreased cost with ultrasound guided versus blind injection.  Verbal informed consent obtained.  Time-out conducted.  Noted no overlying erythema, induration, or other signs of local  infection.  Skin prepped in a sterile fashion.  Local anesthesia: Topical Ethyl chloride.  With sterile technique and under real time ultrasound guidance:  The 25-gauge 1 inch needle patient was injected with a total of 1 mL of 0.5% Marcaine and 1 mL of Kenalog 41 g/dL. This was done and a medial approach to the fibroid. Completed without difficulty  Pain immediately resolved suggesting accurate placement of the medication.  Advised to call if fevers/chills, erythema, induration, drainage, or persistent bleeding.  Images permanently stored and available for review in the ultrasound unit.  Impression: Technically successful ultrasound guided injection.    Impression and Recommendations:     This case required medical decision making of moderate complexity.      Note: This dictation was prepared with Dragon dictation along with smaller phrase technology. Any transcriptional errors that result from this process are unintentional.

## 2016-07-08 ENCOUNTER — Other Ambulatory Visit: Payer: Self-pay

## 2016-07-08 ENCOUNTER — Encounter: Payer: Self-pay | Admitting: Family Medicine

## 2016-07-08 ENCOUNTER — Ambulatory Visit (INDEPENDENT_AMBULATORY_CARE_PROVIDER_SITE_OTHER): Payer: 59 | Admitting: Family Medicine

## 2016-07-08 VITALS — BP 110/78 | HR 93 | Wt 207.0 lb

## 2016-07-08 DIAGNOSIS — D2122 Benign neoplasm of connective and other soft tissue of left lower limb, including hip: Secondary | ICD-10-CM | POA: Diagnosis not present

## 2016-07-08 DIAGNOSIS — M79672 Pain in left foot: Secondary | ICD-10-CM

## 2016-07-08 NOTE — Patient Instructions (Signed)
Good to see you  Ice is still a good idea Consider the orthotics.  Wear better shoes for the love of everything.  You know where I am if you need me.

## 2016-07-08 NOTE — Assessment & Plan Note (Signed)
Patient given injection and tolerated the procedure well. We discussed icing regimen and home exercises. Discussed which activities to do in which ones to potentially avoid. Discussed possible need for custom orthotics. Patient will come back again when she is back in the country and we'll discuss.

## 2016-07-12 ENCOUNTER — Ambulatory Visit (INDEPENDENT_AMBULATORY_CARE_PROVIDER_SITE_OTHER): Payer: 59 | Admitting: Family

## 2016-07-12 ENCOUNTER — Encounter: Payer: Self-pay | Admitting: Family

## 2016-07-12 DIAGNOSIS — J0100 Acute maxillary sinusitis, unspecified: Secondary | ICD-10-CM | POA: Diagnosis not present

## 2016-07-12 DIAGNOSIS — J019 Acute sinusitis, unspecified: Secondary | ICD-10-CM | POA: Insufficient documentation

## 2016-07-12 MED ORDER — PREDNISONE 20 MG PO TABS: 20.0000 mg | ORAL_TABLET | Freq: Two times a day (BID) | ORAL | 0 refills | Status: DC

## 2016-07-12 MED ORDER — HYDROCOD POLST-CPM POLST ER 10-8 MG/5ML PO SUER: 5.0000 mL | Freq: Every evening | ORAL | 0 refills | Status: DC | PRN

## 2016-07-12 MED ORDER — AMOXICILLIN-POT CLAVULANATE 875-125 MG PO TABS: 1.0000 | ORAL_TABLET | Freq: Two times a day (BID) | ORAL | 0 refills | Status: DC

## 2016-07-12 NOTE — Progress Notes (Signed)
Subjective:    Patient ID: Megan Dennis, female    DOB: [birth year], 30 y.o.   MRN: [id number]  Chief Complaint  Patient presents with  . Cough    x1 week, went to urgent care last wed, was told she did not have flu, productive cough and congestion    HPI:  Megan Dennis is a 30 y.o. female who  has a past medical history of Asthma; Depression; and Migraines. and presents today for an acute office visit.  This is a new problem. Associated symptom of congestion, cough, sore throat, and ear fullness that has been going on for about 1 week. Seen at Urgent Care and was prescribed a decongestant. Reports taking the medication with some improvement in symptoms. No fevers with slight elevated temperature of 99.5 which was about 1 week. Severity of her symptoms is enough to keep her up at night. Course of the symptoms have stayed about the same. No recent antibiotics.    No Known Allergies    Outpatient Medications Prior to Visit  Medication Sig Dispense Refill  . FLUoxetine (PROZAC) 20 MG tablet TAKE 1 TABLET BY MOUTH DAILY. 30 tablet 5   No facility-administered medications prior to visit.     Review of Systems  Constitutional: Negative for chills and fever.  HENT: Positive for ear pain, sinus pressure and sore throat.   Respiratory: Positive for cough and shortness of breath. Negative for chest tightness.   Gastrointestinal: Negative for nausea and vomiting.  Neurological: Positive for headaches.      Objective:    BP 136/84 (BP Location: Left Arm, Patient Position: Sitting, Cuff Size: Normal)   Pulse (!) 52   Temp 98.8 F (37.1 C) (Oral)   Resp 16   Ht 5\' 7"  (1.702 m)   Wt 209 lb 12.8 oz (95.2 kg)   SpO2 94%   BMI 32.86 kg/m  Nursing note and vital signs reviewed.  Physical Exam  Constitutional: She is oriented to person, place, and time. She appears well-developed and well-nourished. No distress.  HENT:  Right Ear: Hearing, tympanic membrane, external ear and  ear canal normal.  Left Ear: Hearing, tympanic membrane, external ear and ear canal normal.  Nose: Right sinus exhibits maxillary sinus tenderness and frontal sinus tenderness. Left sinus exhibits maxillary sinus tenderness and frontal sinus tenderness.  Mouth/Throat: Uvula is midline, oropharynx is clear and moist and mucous membranes are normal.  Neck: Neck supple.  Cardiovascular: Normal rate, regular rhythm, normal heart sounds and intact distal pulses.   Pulmonary/Chest: Effort normal and breath sounds normal.  Neurological: She is alert and oriented to person, place, and time.  Skin: Skin is warm and dry.  Psychiatric: She has a normal mood and affect. Her behavior is normal. Judgment and thought content normal.       Assessment & Plan:   Problem List Items Addressed This Visit      Respiratory   Sinusitis, acute    Symptoms and exam consistent with maxillary sinusitis. Start Augmentin. Start prednisone as needed for inflammation. Start Tussionex as needed for cough and sleep. Continue over-the-counter medications as needed for symptom relief and supportive care. Follow-up if symptoms worsen or fail to improve.      Relevant Medications   amoxicillin-clavulanate (AUGMENTIN) 875-125 MG tablet   predniSONE (DELTASONE) 20 MG tablet   chlorpheniramine-HYDROcodone (TUSSIONEX PENNKINETIC ER) 10-8 MG/5ML SUER    Other Visit Diagnoses   None.      I am having Ms. Kaupp  start on amoxicillin-clavulanate, predniSONE, and chlorpheniramine-HYDROcodone. I am also having her maintain her FLUoxetine.   Meds ordered this encounter  Medications  . amoxicillin-clavulanate (AUGMENTIN) 875-125 MG tablet    Sig: Take 1 tablet by mouth 2 (two) times daily.    Dispense:  20 tablet    Refill:  0    Order Specific Question:   Supervising Provider    Answer:   Pricilla Holm A [id number]  . predniSONE (DELTASONE) 20 MG tablet    Sig: Take 1 tablet (20 mg total) by mouth 2 (two) times daily  with a meal.    Dispense:  10 tablet    Refill:  0    Order Specific Question:   Supervising Provider    Answer:   Pricilla Holm A [id number]  . chlorpheniramine-HYDROcodone (TUSSIONEX PENNKINETIC ER) 10-8 MG/5ML SUER    Sig: Take 5 mLs by mouth at bedtime as needed.    Dispense:  115 Bottle    Refill:  0    Order Specific Question:   Supervising Provider    Answer:   Pricilla Holm A [id number]     Follow-up: Return if symptoms worsen or fail to improve.  Mauricio Po, FNP

## 2016-07-12 NOTE — Patient Instructions (Addendum)
Thank you for choosing [facility].  SUMMARY AND INSTRUCTIONS:  Medication:  Your prescription(s) have been submitted to your pharmacy or been printed and provided for you. Please take as directed and contact our office if you believe you are having problem(s) with the medication(s) or have any questions.   Follow up:  If your symptoms worsen or fail to improve, please contact our office for further instruction, or in case of emergency go directly to the emergency room at the closest medical facility.    General Recommendations:    Please drink plenty of fluids.  Get plenty of rest   Sleep in humidified air  Use saline nasal sprays  Netti pot   OTC Medications:  Decongestants - helps relieve congestion   Flonase (generic fluticasone) or Nasacort (generic triamcinolone) - please make sure to use the "cross-over" technique at a 45 degree angle towards the opposite eye as opposed to straight up the nasal passageway.   Sudafed (generic pseudoephedrine - Note this is the one that is available behind the pharmacy counter); Products with phenylephrine (-PE) may also be used but is often not as effective as pseudoephedrine.   If you have HIGH BLOOD PRESSURE - Coricidin HBP; AVOID any product that is -D as this contains pseudoephedrine which may increase your blood pressure.  Afrin (oxymetazoline) every 6-8 hours for up to 3 days.   Allergies - helps relieve runny nose, itchy eyes and sneezing   Claritin (generic loratidine), Allegra (fexofenidine), or Zyrtec (generic cyrterizine) for runny nose. These medications should not cause drowsiness.  Note - Benadryl (generic diphenhydramine) may be used however may cause drowsiness  Cough -   Delsym or Robitussin (generic dextromethorphan)  Expectorants - helps loosen mucus to ease removal   Mucinex (generic guaifenesin) as directed on the package.  Headaches / General Aches   Tylenol (generic acetaminophen) - DO  NOT EXCEED 3 grams (3,000 mg) in a 24 hour time period  Advil/Motrin (generic ibuprofen)   Sore Throat -   Salt water gargle   Chloraseptic (generic benzocaine) spray or lozenges / Sucrets (generic dyclonine)    Sinusitis Sinusitis is redness, soreness, and inflammation of the paranasal sinuses. Paranasal sinuses are air pockets within the bones of your face (beneath the eyes, the middle of the forehead, or above the eyes). In healthy paranasal sinuses, mucus is able to drain out, and air is able to circulate through them by way of your nose. However, when your paranasal sinuses are inflamed, mucus and air can become trapped. This can allow bacteria and other germs to grow and cause infection. Sinusitis can develop quickly and last only a short time (acute) or continue over a long period (chronic). Sinusitis that lasts for more than 12 weeks is considered chronic.  CAUSES  Causes of sinusitis include:  Allergies.  Structural abnormalities, such as displacement of the cartilage that separates your nostrils (deviated septum), which can decrease the air flow through your nose and sinuses and affect sinus drainage.  Functional abnormalities, such as when the small hairs (cilia) that line your sinuses and help remove mucus do not work properly or are not present. SIGNS AND SYMPTOMS  Symptoms of acute and chronic sinusitis are the same. The primary symptoms are pain and pressure around the affected sinuses. Other symptoms include:  Upper toothache.  Earache.  Headache.  Bad breath.  Decreased sense of smell and taste.  A cough, which worsens when you are lying flat.  Fatigue.  Fever.  Thick drainage   from your nose, which often is green and may contain pus (purulent).  Swelling and warmth over the affected sinuses. DIAGNOSIS  Your health care provider will perform a physical exam. During the exam, your health care provider may:  Look in your nose for signs of abnormal growths  in your nostrils (nasal polyps).  Tap over the affected sinus to check for signs of infection.  View the inside of your sinuses (endoscopy) using an imaging device that has a light attached (endoscope). If your health care provider suspects that you have chronic sinusitis, one or more of the following tests may be recommended:  Allergy tests.  Nasal culture. A sample of mucus is taken from your nose, sent to a lab, and screened for bacteria.  Nasal cytology. A sample of mucus is taken from your nose and examined by your health care provider to determine if your sinusitis is related to an allergy. TREATMENT  Most cases of acute sinusitis are related to a viral infection and will resolve on their own within 10 days. Sometimes medicines are prescribed to help relieve symptoms (pain medicine, decongestants, nasal steroid sprays, or saline sprays).  However, for sinusitis related to a bacterial infection, your health care provider will prescribe antibiotic medicines. These are medicines that will help kill the bacteria causing the infection.  Rarely, sinusitis is caused by a fungal infection. In theses cases, your health care provider will prescribe antifungal medicine. For some cases of chronic sinusitis, surgery is needed. Generally, these are cases in which sinusitis recurs more than 3 times per year, despite other treatments. HOME CARE INSTRUCTIONS   Drink plenty of water. Water helps thin the mucus so your sinuses can drain more easily.  Use a humidifier.  Inhale steam 3 to 4 times a day (for example, sit in the bathroom with the shower running).  Apply a warm, moist washcloth to your face 3 to 4 times a day, or as directed by your health care provider.  Use saline nasal sprays to help moisten and clean your sinuses.  Take medicines only as directed by your health care provider.  If you were prescribed either an antibiotic or antifungal medicine, finish it all even if you start to feel  better. SEEK IMMEDIATE MEDICAL CARE IF:  You have increasing pain or severe headaches.  You have nausea, vomiting, or drowsiness.  You have swelling around your face.  You have vision problems.  You have a stiff neck.  You have difficulty breathing. MAKE SURE YOU:   Understand these instructions.  Will watch your condition.  Will get help right away if you are not doing well or get worse. Document Released: 10/03/2005 Document Revised: 02/17/2014 Document Reviewed: 10/18/2011 Exit[employer] Patient Information 2015 [employer]. This information is not intended to replace advice given to you by your health care provider. Make sure you discuss any questions you have with your health care provider.   

## 2016-07-12 NOTE — Assessment & Plan Note (Signed)
Symptoms and exam consistent with maxillary sinusitis. Start Augmentin. Start prednisone as needed for inflammation. Start Tussionex as needed for cough and sleep. Continue over-the-counter medications as needed for symptom relief and supportive care. Follow-up if symptoms worsen or fail to improve.

## 2016-08-08 ENCOUNTER — Encounter: Payer: Self-pay | Admitting: Family Medicine

## 2016-08-08 ENCOUNTER — Ambulatory Visit: Payer: Self-pay

## 2016-08-08 ENCOUNTER — Ambulatory Visit (INDEPENDENT_AMBULATORY_CARE_PROVIDER_SITE_OTHER): Payer: 59 | Admitting: Family Medicine

## 2016-08-08 VITALS — BP 108/82 | HR 85

## 2016-08-08 DIAGNOSIS — M79672 Pain in left foot: Secondary | ICD-10-CM | POA: Diagnosis not present

## 2016-08-08 DIAGNOSIS — S93402A Sprain of unspecified ligament of left ankle, initial encounter: Secondary | ICD-10-CM

## 2016-08-08 DIAGNOSIS — IMO0001 Reserved for inherently not codable concepts without codable children: Secondary | ICD-10-CM

## 2016-08-08 NOTE — Assessment & Plan Note (Signed)
Patient ankle sprain. Discussed icing regimen and home exercise. We discussed which activities to do a which was to avoid. Patient will get a brace of the counter. Any worsening symptoms come back otherwise will follow-up in 4 weeks.

## 2016-08-08 NOTE — Patient Instructions (Signed)
Good to see you  ASO would be helpful  Wear it daily for 1 week then with exercises for 3-4 weeks.  Ice 20 minutes 2 times daily. Usually after activity and before bed. pennsaid pinkie amount topically 2 times daily as needed.  If not better in 2-3 weeks come back and see me.  You will be fine.

## 2016-08-08 NOTE — Progress Notes (Signed)
[name] [address], [place] [address] Phone: [phone] Subjective:      CC: Left foot pain follow-up  [id number]  Megan Dennis is a 30 y.o. female coming in with complaint of left foot pain. Patient was having pain over the lateral aspect of the foot as well as a little bit on her heel. Patient had a stress fracture at one point but seemed to resolve. Patient also had a fibroma. Was given an injection and doing well. Patient though did trip and started having pain on the lateral aspect of the foot. States that it seemed to get better for about 3 days and then went to the gym. Significant pain this morning and was unable to walk initially. Now able to walk but continues to have the discomfort. Mild swelling. Does not remember exactly what happened with the injury initially.       Past Medical History:  Diagnosis Date  . Asthma    Exercised Induced, and as a child  . Depression   . Migraines    Past Surgical History:  Procedure Laterality Date  . lipoma removal  1992  . TONSILLECTOMY  2000  . widsom teeth  2002   Social History   Social History  . Marital status: Single    Spouse name: N/A  . Number of children: 0  . Years of education: 17   Occupational History  . 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 Smoking status: Never Smoker  . Smokeless tobacco: Never Used  . Alcohol use 0.0 oz/week     Comment: rarely  . Drug use: No  . Sexual activity: Yes    Birth control/ protection: None   Other Topics Concern  . Not on file   Social History Narrative   Born and raised in [place]. 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 Fun: Sleep when not working.    Denies religious beliefs effecting health care.    No Known Allergies Family History  Problem Relation Age of Onset  . Hyperlipidemia Maternal Grandmother   . Dementia Paternal Grandfather     Past medical history, social, surgical and  family history all reviewed in electronic medical record.  No pertanent information unless stated regarding to the chief complaint.   Review of Systems: No headache, visual changes, nausea, vomiting, diarrhea, constipation, dizziness, abdominal pain, skin rash, fevers, chills, night sweats, weight loss, swollen lymph nodes, body aches, joint swelling, muscle aches, chest pain, shortness of breath, mood changes.   Objective  There were no vitals taken for this visit.  General: No apparent distress alert and oriented x3 mood and affect normal, dressed appropriately.  HEENT: Pupils equal, extraocular movements intact  Respiratory: Patient's speak in full sentences and does not appear short of breath  Cardiovascular: No lower extremity edema, non tender, no erythema  Skin: Warm dry intact with no signs of infection or rash on extremities or on axial skeleton.  Abdomen: Soft nontender  Neuro: Cranial nerves II through XII are intact, neurovascularly intact in all extremities with 2+ DTRs and 2+ pulses.  Lymph: No lymphadenopathy of posterior or anterior cervical chain or axillae bilaterally.  Gait Mild antalgic gait MSK:  Non tender with full range of motion and good stability and symmetric strength and tone of shoulders, elbows, wrist, hip, knee bilaterally.  Foot exam shows patient does have mild breakdown of the transverse arch bilaterally left greater than right.  Negative squeeze test.  Nontender on the foot itself.  Ankle: Left No visible erythema or swelling. Range of motion is full in all directions. Strength is 5/5 in all directions. ATFL painful to palpation distally as well as with stressing Talar dome nontender; No pain at base of 5th MT; No tenderness over cuboid; No tenderness over N spot or navicular prominence No tenderness on posterior aspects of lateral and medial malleolus mild gapping of the ATFL with pain No sign of peroneal tendon subluxations or tenderness to  palpation Negative tarsal tunnel tinel's Able to walk 4 steps. Contralateral ankle unremarkable  MSK US performed of: Left ankle This study was ordered, performed, and interpreted by Charlann Boxer D.O.  Foot/Ankle:   All structures visualized.   Talar dome unremarkable  Ankle mortise without effusion. Peroneus longus and brevis tendons unremarkable on long and transverse views without sheath effusions. Posterior tibialis, flexor hallucis longus, and flexor digitorum longus tendons unremarkable on long and transverse views without sheath effusions. Achilles tendon visualized along length of tendon and unremarkable on long and transverse views without sheath effusion. ATFL does have increasing Doppler flow and hypoechoic changes. Deltoid Ligament unremarkable and intact. Plantar fascia intact  Fibroma that was noted initially seen significantly smaller   IMPRESSION:  Partial tear of ATFL      Impression and Recommendations:     This case required medical decision making of moderate complexity.      Note: This dictation was prepared with Dragon dictation along with smaller phrase technology. Any transcriptional errors that result from this process are unintentional.

## 2016-09-26 ENCOUNTER — Telehealth: Payer: Self-pay | Admitting: Family Medicine

## 2016-09-26 NOTE — Telephone Encounter (Signed)
We can do 930 on Wed ( I know, sorry)

## 2016-09-26 NOTE — Telephone Encounter (Signed)
Spoke to pt, scheduled her for 930am on Wednesday.

## 2016-09-26 NOTE — Telephone Encounter (Signed)
Patient states she is having really bad foot pain.  States she would like to see Dr. Deneise Lever b/c she is off work and does not want to wait until his next available appt on Monday.  Please follow up in regard.

## 2016-09-27 NOTE — Progress Notes (Signed)
[name] [address] [place], [place] [address] Phone: [phone] Subjective:      CC: Left foot pain follow-up  [id number]  Megan Dennis is a 30 y.o. female coming in with complaint of left foot pain. Patient was having pain over the lateral aspect of the foot as well as a little bit on her heel.Have a fibroma.  Starting to hurt again, failed all conservative therapy  Discussed icing and been noncompliant on exercises. .Patient is considering custom orthotics.       Past Medical History:  Diagnosis Date  . Asthma    Exercised Induced, and as a child  . Depression   . Migraines    Past Surgical History:  Procedure Laterality Date  . lipoma removal  1992  . TONSILLECTOMY  2000  . widsom teeth  2002   Social History   Social History  . Marital status: Single    Spouse name: N/A  . Number of children: 0  . Years of education: 50   Occupational History  . 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 Smoking status: Never Smoker  . Smokeless tobacco: Never Used  . Alcohol use 0.0 oz/week     Comment: rarely  . Drug use: No  . Sexual activity: Yes    Birth control/ protection: None   Other Topics Concern  . None   Social History Narrative   Born and raised in [place]. 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 Fun: Sleep when not working.    Denies religious beliefs effecting health care.    No Known Allergies Family History  Problem Relation Age of Onset  . Hyperlipidemia Maternal Grandmother   . Dementia Paternal Grandfather     Past medical history, social, surgical and family history all reviewed in electronic medical record.  No pertanent information unless stated regarding to the chief complaint.   Systems examined below as of 09/28/16 General: NAD A&O x3 mood, affect normal  HEENT: Pupils equal, extraocular movements intact no nystagmus Respiratory: not short of breath at rest or with  speaking Cardiovascular: No lower extremity edema, non tender Skin: Warm dry intact with no signs of infection or rash on extremities or on axial skeleton. Abdomen: Soft nontender, no masses Neuro: Cranial nerves  intact, neurovascularly intact in all extremities with 2+ DTRs and 2+ pulses. Lymph: No lymphadenopathy appreciated today  Gait normal with good balance and coordination.  MSK: Non tender with full range of motion and good stability and symmetric strength and tone of shoulders, elbows, wrist,  knee hips and ankles bilaterally.    Objective  Blood pressure 116/82, pulse 98, height 5\' 7"  (1.702 m), SpO2 97 %.  Systems examined below as of 09/28/16 General: NAD A&O x3 mood, affect normal  HEENT: Pupils equal, extraocular movements intact no nystagmus Respiratory: not short of breath at rest or with speaking Cardiovascular: No lower extremity edema, non tender Skin: Warm dry intact with no signs of infection or rash on extremities or on axial skeleton. Abdomen: Soft nontender, no masses Neuro: Cranial nerves  intact, neurovascularly intact in all extremities with 2+ DTRs and 2+ pulses. Lymph: No lymphadenopathy appreciated today  Gait normal with good balance and coordination.  MSK: Non tender with full range of motion and good stability and symmetric strength and tone of shoulders, elbows, wrist,  knee hips and ankles bilaterally.  rength and tone of shoulders, elbows, wrist, hip, knee  bilaterally.  Foot exam shows patient does have mild breakdown of the transverse arch bilaterally left greater than right. Negative squeeze test.  Nontender on the foot itself.Increase in rigidity of the mid foot.          Procedure Note   Patient was fitted for a : standard, cushioned, semi-rigid orthotic. The orthotic was heated and afterward the patient patient seated position and molded The patient was positioned in subtalar neutral position and 10 degrees of ankle dorsiflexion in a  weight bearing stance. After completion of molding, patient did have orthotic management The blank was ground to a stable position for weight bearing. Size:8.5 Base: Carbon fiber Additional Posting and Padding: medial 35/200 The patient ambulated these, and they were very comfortable. I spent 45 minutes with this patient, greater than 50% was face-to-face time counseling regarding the below diagnosis.

## 2016-09-28 ENCOUNTER — Ambulatory Visit (INDEPENDENT_AMBULATORY_CARE_PROVIDER_SITE_OTHER): Payer: 59 | Admitting: Family Medicine

## 2016-09-28 ENCOUNTER — Encounter: Payer: Self-pay | Admitting: Family Medicine

## 2016-09-28 ENCOUNTER — Ambulatory Visit: Payer: 59 | Admitting: Family Medicine

## 2016-09-28 DIAGNOSIS — M216X2 Other acquired deformities of left foot: Secondary | ICD-10-CM

## 2016-09-28 MED ORDER — VITAMIN D (ERGOCALCIFEROL) 1.25 MG (50000 UNIT) PO CAPS: 50000.0000 [IU] | ORAL_CAPSULE | ORAL | 0 refills | Status: DC

## 2016-09-28 NOTE — Patient Instructions (Addendum)
Good to see you  We will get you in orthotics TODAY! I think we need a little help Wear them 2 hours today and increase 1 hour a day until a full day Try to do the exercises Focus on pushing off the large toe Go shoe shopping Will miss you but you know where I am if you need me! Happy holidays!

## 2016-09-28 NOTE — Assessment & Plan Note (Signed)
Orthotics made today Should do well.  Discussed icing, HEP and proper shoes Cross train for working out RTC in 4-6 weeks.

## 2016-10-07 IMAGING — DX DG FOOT COMPLETE 3+V*L*
3 series · 3 of 3 positions shown · non-contrast
Comparison: None.

CLINICAL DATA: Proximal anterior foot pain for 3 months, no injury

EXAM:
LEFT FOOT - COMPLETE 3+ VIEW

[foot ap]
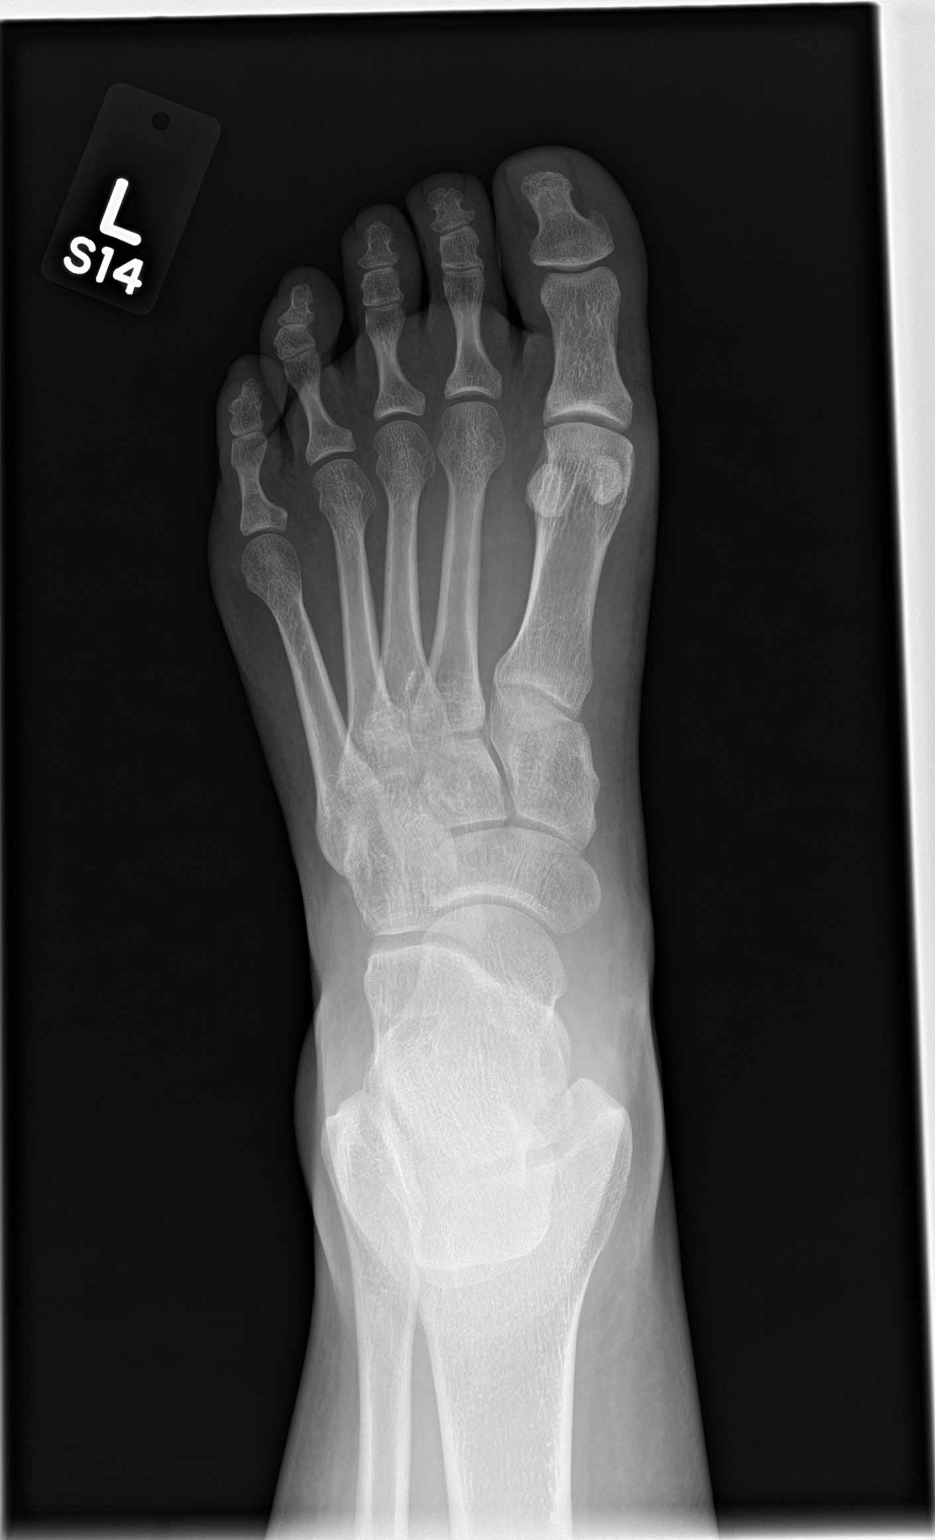

[foot obl]
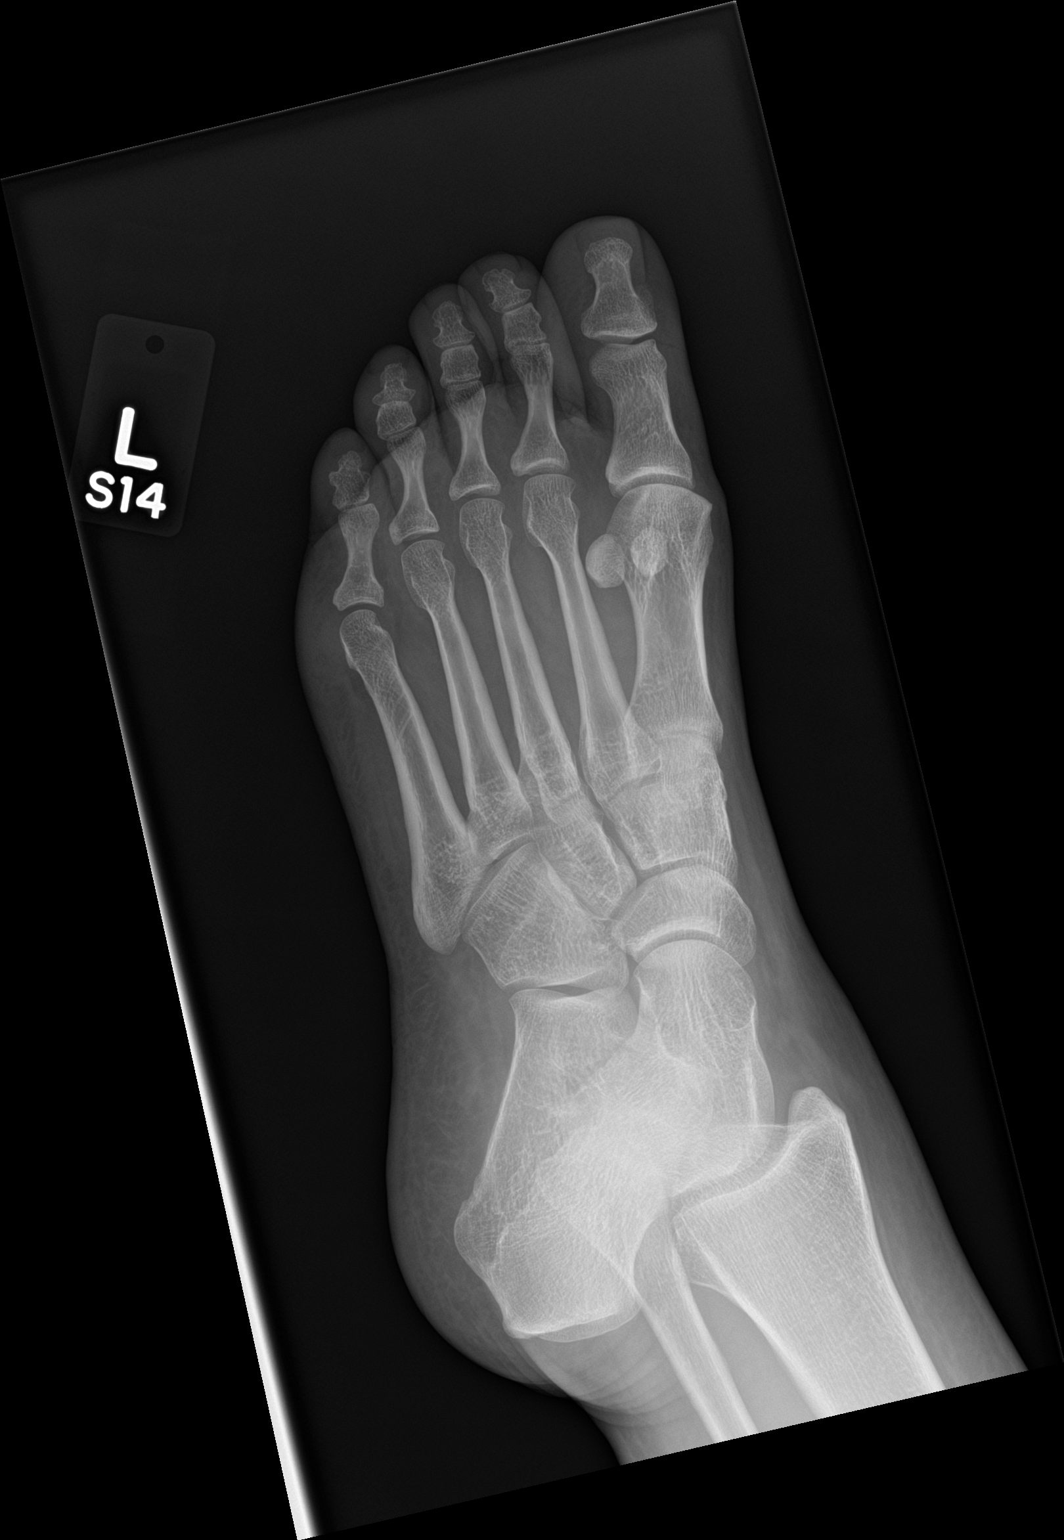

[foot lat]
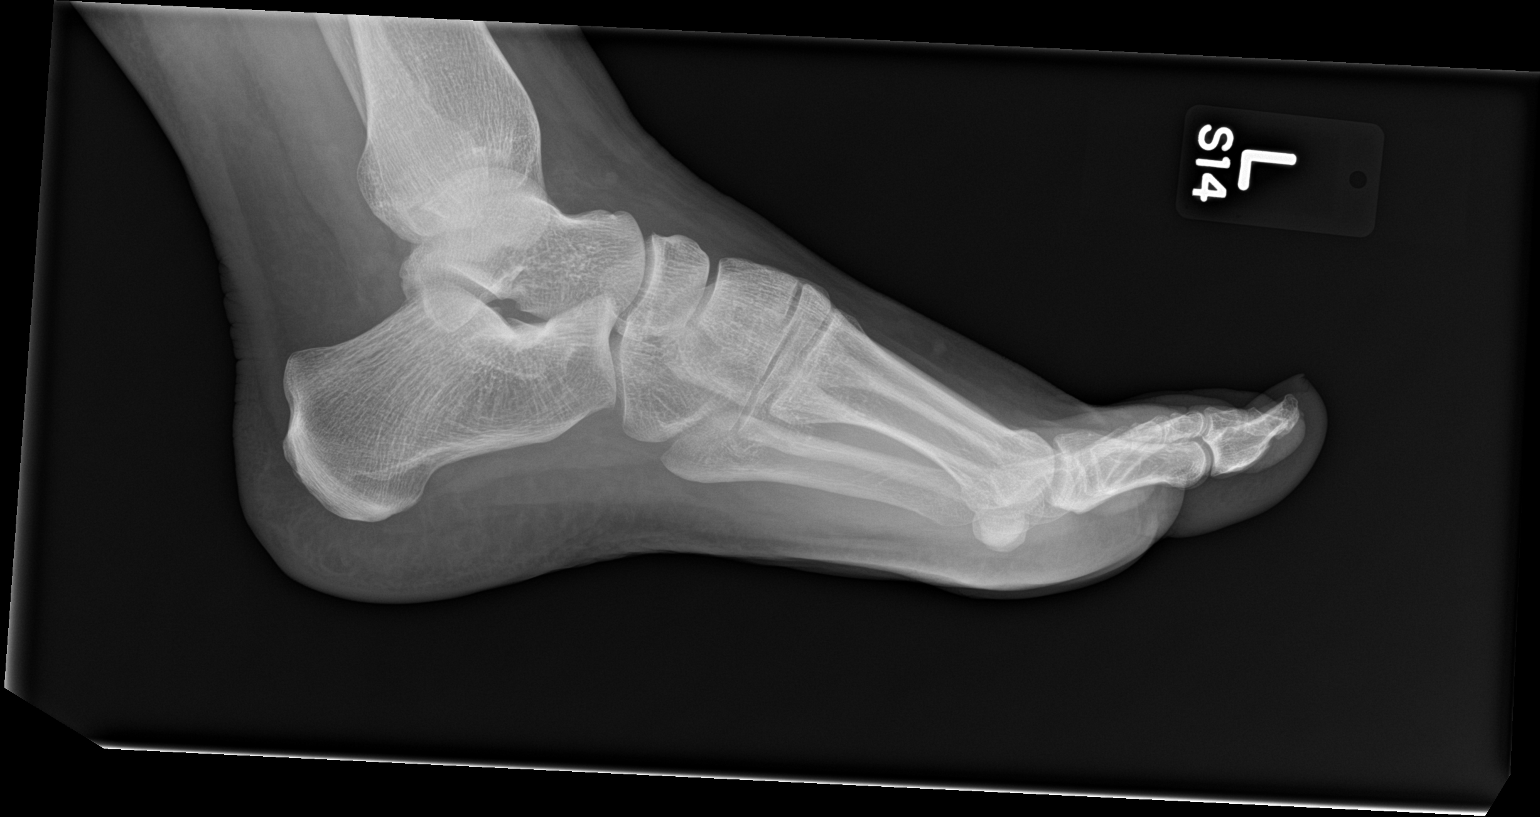

[3 of 3 positions shown; findings below may reference images not displayed]

FINDINGS: Tarsal-metatarsal alignment is normal. Joint spaces are normal. No
fracture is seen. No periosteal reaction is noted.
IMPRESSION: Negative.

## 2017-02-09 ENCOUNTER — Telehealth: Payer: Self-pay | Admitting: Family

## 2017-02-09 NOTE — Telephone Encounter (Signed)
Left message for patient to call back to see if she would be able to come in tomorrow at 8am.

## 2017-02-09 NOTE — Telephone Encounter (Signed)
Pt has some question about her orthotics?  She is wanting to know if she can come tomorrow to get them adjust?    Best number [phone]

## 2017-02-10 NOTE — Telephone Encounter (Signed)
Left message for patient to come in at 10:00am on 02/10/17 per her availability this morning.

## 2017-02-13 NOTE — Telephone Encounter (Signed)
Awaiting patient to return call to adjust orthotics.

## 2017-02-28 ENCOUNTER — Encounter: Payer: Self-pay | Admitting: Family

## 2017-02-28 ENCOUNTER — Ambulatory Visit (INDEPENDENT_AMBULATORY_CARE_PROVIDER_SITE_OTHER): Payer: 59 | Admitting: Family

## 2017-02-28 VITALS — BP 122/86 | HR 106 | Temp 98.7°F | Resp 16 | Ht 67.0 in | Wt 196.0 lb

## 2017-02-28 DIAGNOSIS — F418 Other specified anxiety disorders: Secondary | ICD-10-CM

## 2017-02-28 MED ORDER — DIAZEPAM 5 MG PO TABS: 5.0000 mg | ORAL_TABLET | Freq: Two times a day (BID) | ORAL | 0 refills | Status: DC | PRN

## 2017-02-28 MED ORDER — ESCITALOPRAM OXALATE 10 MG PO TABS: 10.0000 mg | ORAL_TABLET | Freq: Every day | ORAL | 0 refills | Status: DC

## 2017-02-28 MED FILL — ESCITALOPRAM 10 MG TABLET: 10 | 30 days supply | Qty: 30 | Fill #0

## 2017-02-28 MED FILL — diazePAM 5 MG TABS: 5 | 7 days supply | Qty: 30 | Fill #0

## 2017-02-28 NOTE — Patient Instructions (Signed)
Thank you for choosing [facility].  SUMMARY AND INSTRUCTIONS:  Start taking the lexapro daily.   Take the Valium as needed for anxiety.   Medication:  Your prescription(s) have been submitted to your pharmacy or been printed and provided for you. Please take as directed and contact our office if you believe you are having problem(s) with the medication(s) or have any questions.  Follow up:  If your symptoms worsen or fail to improve, please contact our office for further instruction, or in case of emergency go directly to the emergency room at the closest medical facility.

## 2017-02-28 NOTE — Assessment & Plan Note (Signed)
Situational anxiety related to airline travel refractory to previous lorazepam. Air travel is within 24 hours which is less than optimal situation, however start Lexapro daily and diazepam as needed for anxiety. Recommend counseling upon return. Follow up if symptoms do not improve or worsen.

## 2017-02-28 NOTE — Progress Notes (Signed)
Subjective:    Patient ID: Megan Dennis, female    DOB: 09-Oct-1986, 31 y.o.   MRN: [id number]  Chief Complaint  Patient presents with  . Medication Management    would like something for anxiety for flight     HPI:  Megan Dennis is a 31 y.o. female who  has a past medical history of Asthma; Depression; and Migraines. and presents today for an acute office visit.  Situational anxiety - Previously prescribed lorazepam for situational anxiety related to airline travel which she indicates did not help very much. She is planning a trip to [place] and has experienced increasing levels of anxiety with a severity that has led to near panic attacks and crying spells related to upcoming air travel.    No Known Allergies    Outpatient Medications Prior to Visit  Medication Sig Dispense Refill  . chlorpheniramine-HYDROcodone (TUSSIONEX PENNKINETIC ER) 10-8 MG/5ML SUER Take 5 mLs by mouth at bedtime as needed. 115 Bottle 0  . Vitamin D, Ergocalciferol, (DRISDOL) 50000 units CAPS capsule Take 1 capsule (50,000 Units total) by mouth every 7 (seven) days. 12 capsule 0  . FLUoxetine (PROZAC) 20 MG tablet TAKE 1 TABLET BY MOUTH DAILY. 30 tablet 5   No facility-administered medications prior to visit.     Review of Systems  Constitutional: Negative for chills and fever.  Respiratory: Negative for chest tightness and shortness of breath.   Cardiovascular: Negative for chest pain and palpitations.  Psychiatric/Behavioral: Positive for sleep disturbance. Negative for confusion, decreased concentration, dysphoric mood, hallucinations and suicidal ideas. The patient is nervous/anxious.       Objective:    BP 122/86 (BP Location: Left Arm, Patient Position: Sitting, Cuff Size: Large)   Pulse (!) 106   Temp 98.7 F (37.1 C) (Oral)   Resp 16   Ht 5\' 7"  (1.702 m)   Wt 196 lb (88.9 kg)   SpO2 98%   BMI 30.70 kg/m  Nursing note and vital signs reviewed.  Physical Exam  Constitutional:  She is oriented to person, place, and time. She appears well-developed and well-nourished. No distress.  Cardiovascular: Normal rate, regular rhythm, normal heart sounds and intact distal pulses.   Pulmonary/Chest: Effort normal and breath sounds normal.  Neurological: She is alert and oriented to person, place, and time.  Skin: Skin is warm and dry.  Psychiatric: Her behavior is normal. Judgment and thought content normal. Her mood appears anxious.       Assessment & Plan:   Problem List Items Addressed This Visit      Other   Situational anxiety - Primary    Situational anxiety related to airline travel refractory to previous lorazepam. Air travel is within 24 hours which is less than optimal situation, however start Lexapro daily and diazepam as needed for anxiety. Recommend counseling upon return. Follow up if symptoms do not improve or worsen.       Relevant Medications   diazepam (VALIUM) 5 MG tablet   escitalopram (LEXAPRO) 10 MG tablet       I have discontinued Ms. Brew's FLUoxetine. I am also having her start on diazepam. Additionally, I am having her maintain her chlorpheniramine-HYDROcodone, Vitamin D (Ergocalciferol), and escitalopram.   Meds ordered this encounter  Medications  . DISCONTD: escitalopram (LEXAPRO) 10 MG tablet    Sig: Take 1 tablet (10 mg total) by mouth daily.    Dispense:  30 tablet    Refill:  0    Order Specific Question:  Supervising Provider    Answer:   Pricilla Holm A [[id number]]  . diazepam (VALIUM) 5 MG tablet    Sig: Take 1-2 tablets (5-10 mg total) by mouth every 12 (twelve) hours as needed for anxiety.    Dispense:  30 tablet    Refill:  0    Order Specific Question:   Supervising Provider    Answer:   Pricilla Holm A [[id number]]  . escitalopram (LEXAPRO) 10 MG tablet    Sig: Take 1 tablet (10 mg total) by mouth daily.    Dispense:  30 tablet    Refill:  0    Order Specific Question:   Supervising Provider    Answer:    Pricilla Holm A [[id number]]     Follow-up: Return if symptoms worsen or fail to improve.  Mauricio Po, FNP

## 2017-03-23 DIAGNOSIS — Z6831 Body mass index (BMI) 31.0-31.9, adult: Secondary | ICD-10-CM | POA: Diagnosis not present

## 2017-03-23 DIAGNOSIS — Z01419 Encounter for gynecological examination (general) (routine) without abnormal findings: Secondary | ICD-10-CM | POA: Diagnosis not present

## 2017-04-26 DIAGNOSIS — D2239 Melanocytic nevi of other parts of face: Secondary | ICD-10-CM | POA: Diagnosis not present

## 2017-04-26 DIAGNOSIS — D2271 Melanocytic nevi of right lower limb, including hip: Secondary | ICD-10-CM | POA: Diagnosis not present

## 2017-04-26 DIAGNOSIS — D225 Melanocytic nevi of trunk: Secondary | ICD-10-CM | POA: Diagnosis not present

## 2017-06-20 ENCOUNTER — Other Ambulatory Visit: Payer: Self-pay | Admitting: Family

## 2017-06-21 MED FILL — ESCITALOPRAM 10 MG TABLET: 10 | 30 days supply | Qty: 30 | Fill #0

## 2017-08-24 ENCOUNTER — Telehealth: Payer: Self-pay | Admitting: Family

## 2017-08-24 MED ORDER — ESCITALOPRAM OXALATE 10 MG PO TABS: 10.0000 mg | ORAL_TABLET | Freq: Every day | ORAL | 1 refills | Status: DC

## 2017-08-24 MED FILL — ESCITALOPRAM 10 MG TABLET: 10 | 30 days supply | Qty: 30 | Fill #0

## 2017-08-24 NOTE — Telephone Encounter (Signed)
Per office policy sent 30 day to local pharmacy until appt.../lmb  

## 2017-08-24 NOTE — Telephone Encounter (Signed)
Pt called requesting a refill on escitalopram (LEXAPRO) 10 MG tablet to be sent to [facility]. She is scheduled to see Hollie Beach in January.

## 2017-10-02 MED FILL — ESCITALOPRAM 10 MG TABLET: 10 | 30 days supply | Qty: 30 | Fill #1

## 2017-10-26 ENCOUNTER — Ambulatory Visit: Payer: 59 | Admitting: Nurse Practitioner

## 2017-11-15 ENCOUNTER — Ambulatory Visit (INDEPENDENT_AMBULATORY_CARE_PROVIDER_SITE_OTHER): Payer: 59 | Admitting: Nurse Practitioner

## 2017-11-15 ENCOUNTER — Encounter: Payer: Self-pay | Admitting: Nurse Practitioner

## 2017-11-15 ENCOUNTER — Other Ambulatory Visit (INDEPENDENT_AMBULATORY_CARE_PROVIDER_SITE_OTHER): Payer: 59

## 2017-11-15 VITALS — BP 122/80 | HR 91 | Resp 16 | Ht 67.0 in | Wt 200.0 lb

## 2017-11-15 DIAGNOSIS — F329 Major depressive disorder, single episode, unspecified: Secondary | ICD-10-CM

## 2017-11-15 DIAGNOSIS — F32A Depression, unspecified: Secondary | ICD-10-CM

## 2017-11-15 DIAGNOSIS — E669 Obesity, unspecified: Secondary | ICD-10-CM

## 2017-11-15 LAB — CBC WITH DIFFERENTIAL/PLATELET
BASOS ABS: 0.1 10*3/uL (ref 0.0–0.1)
Basophils Relative: 0.9 % (ref 0.0–3.0)
EOS PCT: 3 % (ref 0.0–5.0)
Eosinophils Absolute: 0.3 10*3/uL (ref 0.0–0.7)
HEMATOCRIT: 47.5 % — AB (ref 36.0–46.0)
Hemoglobin: 16.1 g/dL — ABNORMAL HIGH (ref 12.0–15.0)
LYMPHS ABS: 2.8 10*3/uL (ref 0.7–4.0)
LYMPHS PCT: 26.7 % (ref 12.0–46.0)
MCHC: 34 g/dL (ref 30.0–36.0)
MCV: 93.4 fl (ref 78.0–100.0)
MONOS PCT: 7.2 % (ref 3.0–12.0)
Monocytes Absolute: 0.7 10*3/uL (ref 0.1–1.0)
NEUTROS PCT: 62.2 % (ref 43.0–77.0)
Neutro Abs: 6.4 10*3/uL (ref 1.4–7.7)
Platelets: 344 10*3/uL (ref 150.0–400.0)
RBC: 5.08 Mil/uL (ref 3.87–5.11)
RDW: 13.2 % (ref 11.5–15.5)
WBC: 10.3 10*3/uL (ref 4.0–10.5)

## 2017-11-15 LAB — COMPREHENSIVE METABOLIC PANEL
ALBUMIN: 4.6 g/dL (ref 3.5–5.2)
ALK PHOS: 51 U/L (ref 39–117)
ALT: 18 U/L (ref 0–35)
AST: 15 U/L (ref 0–37)
BILIRUBIN TOTAL: 0.6 mg/dL (ref 0.2–1.2)
BUN: 22 mg/dL (ref 6–23)
CALCIUM: 9.6 mg/dL (ref 8.4–10.5)
CO2: 24 mEq/L (ref 19–32)
Chloride: 104 mEq/L (ref 96–112)
Creatinine, Ser: 0.78 mg/dL (ref 0.40–1.20)
GFR: 90.51 mL/min (ref >60.00)
Glucose, Bld: 91 mg/dL (ref 70–99)
POTASSIUM: 4.1 meq/L (ref 3.5–5.1)
Sodium: 136 mEq/L (ref 135–145)
TOTAL PROTEIN: 7.6 g/dL (ref 6.0–8.3)

## 2017-11-15 LAB — TSH: TSH: 2.88 u[IU]/mL (ref 0.35–4.50)

## 2017-11-15 LAB — VITAMIN D 25 HYDROXY (VIT D DEFICIENCY, FRACTURES): VITD: 47.8 ng/mL (ref 30.00–100.00)

## 2017-11-15 MED ORDER — FLUOXETINE HCL 40 MG PO CAPS: 40.0000 mg | ORAL_CAPSULE | Freq: Every day | ORAL | 1 refills | Status: DC

## 2017-11-15 MED FILL — FLUoxetine HCL 40 MG CAPS: 40 | 30 days supply | Qty: 30 | Fill #0

## 2017-11-15 NOTE — Progress Notes (Signed)
Name: Megan Dennis   MRN: [id number]    DOB: Jul 06, 1986   Date:11/15/2017       Progress Note  Subjective  Chief Complaint  Chief Complaint  Patient presents with  . Establish Care    depression medication    HPI  Ms Megan Dennis presents today requesting a change to her depression medication and to discuss weight loss.  Depression- This is not a new problem She has been maintained on antidepressants since 2014. She was initially started on prozac but at some point stopped taking it She began to feel depressed again and was started on lexapro She says the lexapro "keeps me from feeling bad" but the prozac worked better because "I didn't just not feel bad, I felt really good." She is wanting to go back on prozac today. She denies anxiety, restlessness, hopeless, thoughts of hurting herself or others.  Depression screen PHQ 2/9 11/15/2017  Decreased Interest 0  Down, Depressed, Hopeless 1  PHQ - 2 Score 1  Altered sleeping 0  Tired, decreased energy 1  Change in appetite 1  Feeling bad or failure about yourself  0  Trouble concentrating 0  Moving slowly or fidgety/restless 0  Suicidal thoughts 0  PHQ-9 Score 3   GAD 7 : Generalized Anxiety Score 11/15/2017  Nervous, Anxious, on Edge 0  Control/stop worrying 0  Worry too much - different things 0  Trouble relaxing 0  Restless 0  Easily annoyed or irritable 1  Afraid - awful might happen 0  Total GAD 7 Score 1    Weight loss- She is interested in weight management She says she has been trying to lose weight for several years and can not. She would like to get back to her college weight but no matter how many diets she has tried she feels like her weight "just wont budge."  She also tries to exercise some every week.  Wt Readings from Last 3 Encounters:  11/15/17 200 lb (90.7 kg)  02/28/17 196 lb (88.9 kg)  07/12/16 209 lb 12.8 oz (95.2 kg)     Patient Active Problem List   Diagnosis Date Noted  . Grade 2 ankle  sprain, left, initial encounter 08/08/2016  . Sinusitis, acute 07/12/2016  . Fibroma of foot 12/24/2015  . Stress fracture of foot 12/03/2015  . Metatarsalgia of left foot 11/12/2015  . Loss of transverse plantar arch of left foot 11/12/2015  . Depression 04/29/2015  . Situational anxiety 04/29/2015  . Acute pharyngitis 10/22/2014  . Routine general medical examination at a health care facility 10/15/2014  . TMJ (temporomandibular joint syndrome) 09/16/2014  . Cervicalgia 08/22/2014  . Nonallopathic lesion of cervical region 08/22/2014    Social History   Tobacco Use  . Smoking status: Never Smoker  . Smokeless tobacco: Never Used  Substance Use Topics  . Alcohol use: Yes    Alcohol/week: 0.0 oz    Comment: rarely     Current Outpatient Medications:  .  diazepam (VALIUM) 5 MG tablet, Take 1-2 tablets (5-10 mg total) by mouth every 12 (twelve) hours as needed for anxiety., Disp: 30 tablet, Rfl: 0 .  escitalopram (LEXAPRO) 10 MG tablet, Take 1 tablet (10 mg total) daily by mouth. Must keep schedule appt w/new provider for future refills, Disp: 30 tablet, Rfl: 1 .  Vitamin D, Ergocalciferol, (DRISDOL) 50000 units CAPS capsule, Take 1 capsule (50,000 Units total) by mouth every 7 (seven) days., Disp: 12 capsule, Rfl: 0  No Known Allergies  Review  of Systems  Constitutional: Negative for chills and fever.  HENT: Negative for hearing loss and sore throat.   Eyes: Negative for blurred vision and double vision.  Respiratory: Negative for cough and shortness of breath.   Cardiovascular: Negative for chest pain and palpitations.  Gastrointestinal: Negative for abdominal pain, constipation and diarrhea.  Genitourinary: Negative for dysuria and hematuria.  Musculoskeletal: Negative for falls, joint pain and myalgias.  Skin: Negative for rash.  Neurological: Negative for speech change and headaches.  Endo/Heme/Allergies: Does not bruise/bleed easily.  Psychiatric/Behavioral: The  patient is not nervous/anxious.     Objective  Vitals:   11/15/17 1312  BP: 122/80  Pulse: 91  Resp: 16  SpO2: 97%  Weight: 200 lb (90.7 kg)  Height: 5\' 7"  (1.702 m)   Body mass index is 31.32 kg/m.  Nursing Note and Vital Signs reviewed.  Physical Exam Constitutional: Patient appears well-developed and well-nourished. No distress.  HENT: Head: Normocephalic and atraumatic Nose: Nose normal. Mouth/Throat: Oropharynx is clear and moist. No oropharyngeal exudate.  Eyes: Conjunctivae are normal. No scleral icterus.  Neck: Normal range of motion. Neck supple.  No thyromegaly present.  Cardiovascular: Normal rate, regular rhythm and normal heart sounds.  No BLE edema. Distal pulses intact. Pulmonary/Chest: Effort normal and breath sounds normal. No respiratory distress. Abdominal: Soft. Bowel sounds are normal, no distension. There is no tenderness. no masses Musculoskeletal: Normal range of motion, no joint effusions. No gross deformities Neurological: She is alert and oriented to person, place, and time. Coordination, balance, strength, speech and gait are normal.  Skin: Skin is warm and dry. No rash noted. No erythema.  Psychiatric: Patient has a normal mood and affect. behavior is normal. Judgment and thought content normal.  Assessment & Plan RTC in 1 month for follow up of prozac  Obesity (BMI 30-39.9) We discussed healthy diet and exercise - See AVS for education provided to patient - CBC with Differential/Platelet; Future - Comprehensive metabolic panel; Future - TSH; Future - Amb Ref to Medical Weight Management - Vitamin D (25 hydroxy); Future

## 2017-11-15 NOTE — Patient Instructions (Addendum)
Please head downstairs for lab work.  You can start the prozac 40mg  the day after you stop the lexapro.  Id like to see you back in about 1 month, to see how you are doing on the lexapro.  I have placed a referral to weight management. Our office will call you to schedule this appointment. You should hear from our office in 7-10 days.  Try to work on your diet and exercise as we discussed. Remember half of your plate should be veggies, one-fourth carbs, one-fourth meat, and don't eat meat at every meal. Also, remember to stay away from sugary drinks. I'd like for you to start incorporating exercise into your daily schedule. Start at 10 minutes a day, working up to 30 minutes five times a week.   It was nice to meet you. Thanks for letting me take care of you today :)   Mediterranean Diet A Mediterranean diet refers to food and lifestyle choices that are based on the traditions of countries located on the [employer]. This way of eating has been shown to help prevent certain conditions and improve outcomes for people who have chronic diseases, like kidney disease and heart disease. What are tips for following this plan? Lifestyle  Cook and eat meals together with your family, when possible.  Drink enough fluid to keep your urine clear or pale yellow.  Be physically active every day. This includes: ? Aerobic exercise like running or swimming. ? Leisure activities like gardening, walking, or housework.  Get 7-8 hours of sleep each night.  If recommended by your health care provider, drink red wine in moderation. This means 1 glass a day for nonpregnant women and 2 glasses a day for men. A glass of wine equals 5 oz (150 mL). Reading food labels  Check the serving size of packaged foods. For foods such as rice and pasta, the serving size refers to the amount of cooked product, not dry.  Check the total fat in packaged foods. Avoid foods that have saturated fat or trans  fats.  Check the ingredients list for added sugars, such as corn syrup. Shopping  At the grocery store, buy most of your food from the areas near the walls of the store. This includes: ? Fresh fruits and vegetables (produce). ? Grains, beans, nuts, and seeds. Some of these may be available in unpackaged forms or large amounts (in bulk). ? Fresh seafood. ? Poultry and eggs. ? Low-fat dairy products.  Buy whole ingredients instead of prepackaged foods.  Buy fresh fruits and vegetables in-season from local farmers markets.  Buy frozen fruits and vegetables in resealable bags.  If you do not have access to quality fresh seafood, buy precooked frozen shrimp or canned fish, such as tuna, salmon, or sardines.  Buy small amounts of raw or cooked vegetables, salads, or olives from the deli or salad bar at your store.  Stock your pantry so you always have certain foods on hand, such as olive oil, canned tuna, canned tomatoes, rice, pasta, and beans. Cooking  Cook foods with extra-virgin olive oil instead of using butter or other vegetable oils.  Have meat as a side dish, and have vegetables or grains as your main dish. This means having meat in small portions or adding small amounts of meat to foods like pasta or stew.  Use beans or vegetables instead of meat in common dishes like chili or lasagna.  Experiment with different cooking methods. Try roasting or broiling vegetables instead of steaming  or sauteing them.  Add frozen vegetables to soups, stews, pasta, or rice.  Add nuts or seeds for added healthy fat at each meal. You can add these to yogurt, salads, or vegetable dishes.  Marinate fish or vegetables using olive oil, lemon juice, garlic, and fresh herbs. Meal planning  Plan to eat 1 vegetarian meal one day each week. Try to work up to 2 vegetarian meals, if possible.  Eat seafood 2 or more times a week.  Have healthy snacks readily available, such as: ? Vegetable sticks  with hummus. ? [place] yogurt. ? Fruit and nut trail mix.  Eat balanced meals throughout the week. This includes: ? Fruit: 2-3 servings a day ? Vegetables: 4-5 servings a day ? Low-fat dairy: 2 servings a day ? Fish, poultry, or lean meat: 1 serving a day ? Beans and legumes: 2 or more servings a week ? Nuts and seeds: 1-2 servings a day ? Whole grains: 6-8 servings a day ? Extra-virgin olive oil: 3-4 servings a day  Limit red meat and sweets to only a few servings a month What are my food choices?  Mediterranean diet ? Recommended ? Grains: Whole-grain pasta. Brown rice. Bulgar wheat. Polenta. Couscous. Whole-wheat bread. [place]. ? Vegetables: Artichokes. Beets. Broccoli. Cabbage. Carrots. Eggplant. Green beans. Chard. Kale. Spinach. Onions. Leeks. Peas. Squash. Tomatoes. Peppers. Radishes. ? Fruits: Apples. Apricots. Avocado. Berries. Bananas. Cherries. Dates. Figs. Grapes. Lemons. Melon. Oranges. Peaches. Plums. Pomegranate. ? Meats and other protein foods: Beans. Almonds. Sunflower seeds. Pine nuts. Peanuts. [place]. Salmon. Scallops. Shrimp. [place]. Tilapia. Clams. Oysters. Eggs. ? Dairy: Low-fat milk. Cheese. Greek yogurt. ? Beverages: Water. Red wine. Herbal tea. ? Fats and oils: Extra virgin olive oil. Avocado oil. Grape seed oil. ? Sweets and desserts: [place] yogurt with honey. Baked apples. Poached pears. Trail mix. ? Seasoning and other foods: Basil. Cilantro. Coriander. Cumin. Mint. Parsley. Sage. Rosemary. Tarragon. Garlic. Oregano. Thyme. Pepper. Balsalmic vinegar. Tahini. Hummus. Tomato sauce. Olives. Mushrooms. ? Limit these ? Grains: Prepackaged pasta or rice dishes. Prepackaged cereal with added sugar. ? Vegetables: Deep fried potatoes (french fries). ? Fruits: Fruit canned in syrup. ? Meats and other protein foods: Beef. Pork. Lamb. Poultry with skin. Hot dogs. [place] Salines. ? Dairy: Ice cream. Sour cream. Whole milk. ? Beverages: Juice. Sugar-sweetened soft drinks.  Beer. Liquor and spirits. ? Fats and oils: Butter. Canola oil. Vegetable oil. Beef fat (tallow). Lard. ? Sweets and desserts: Cookies. Cakes. Pies. Candy. ? Seasoning and other foods: Mayonnaise. Premade sauces and marinades. ? The items listed may not be a complete list. Talk with your dietitian about what dietary choices are right for you. Summary  The Mediterranean diet includes both food and lifestyle choices.  Eat a variety of fresh fruits and vegetables, beans, nuts, seeds, and whole grains.  Limit the amount of red meat and sweets that you eat.  Talk with your health care provider about whether it is safe for you to drink red wine in moderation. This means 1 glass a day for nonpregnant women and 2 glasses a day for men. A glass of wine equals 5 oz (150 mL). This information is not intended to replace advice given to you by your health care provider. Make sure you discuss any questions you have with your health care provider. Document Released: 05/26/2016 Document Revised: 06/28/2016 Document Reviewed: 05/26/2016 Elsevier Interactive Patient Education  2018 [employer]. We

## 2017-11-15 NOTE — Assessment & Plan Note (Signed)
We will stop lexapro and start prozac. She felt her symptoms were best controlled prozac 40 once daily. We did discuss the dosing and side effects of prozac during our visit. We will also check labwork as she c/o not being able to lose weight today - CBC with Differential/Platelet; Future - Comprehensive metabolic panel; Future - TSH; Future - FLUoxetine (PROZAC) 40 MG capsule; Take 1 capsule (40 mg total) by mouth daily.  Dispense: 30 capsule; Refill: 1 - Vitamin D (25 hydroxy); Future She will RTC in 1 month for f/u of medication change

## 2017-11-16 ENCOUNTER — Other Ambulatory Visit (INDEPENDENT_AMBULATORY_CARE_PROVIDER_SITE_OTHER): Payer: 59

## 2017-11-16 DIAGNOSIS — F0150 Vascular dementia without behavioral disturbance: Secondary | ICD-10-CM

## 2017-11-16 DIAGNOSIS — F0151 Vascular dementia with behavioral disturbance: Secondary | ICD-10-CM | POA: Diagnosis not present

## 2017-11-16 DIAGNOSIS — F329 Major depressive disorder, single episode, unspecified: Secondary | ICD-10-CM | POA: Diagnosis not present

## 2017-11-16 DIAGNOSIS — F0153 Vascular dementia, unspecified severity, with mood disturbance: Secondary | ICD-10-CM

## 2017-11-16 LAB — FERRITIN: Ferritin: 34.4 ng/mL (ref 10.0–291.0)

## 2017-12-15 ENCOUNTER — Ambulatory Visit (INDEPENDENT_AMBULATORY_CARE_PROVIDER_SITE_OTHER): Payer: 59 | Admitting: Nurse Practitioner

## 2017-12-15 ENCOUNTER — Encounter: Payer: Self-pay | Admitting: Nurse Practitioner

## 2017-12-15 VITALS — BP 120/80 | HR 65 | Temp 98.5°F | Resp 16 | Ht 67.0 in | Wt 200.0 lb

## 2017-12-15 DIAGNOSIS — M26609 Unspecified temporomandibular joint disorder, unspecified side: Secondary | ICD-10-CM

## 2017-12-15 DIAGNOSIS — F32A Depression, unspecified: Secondary | ICD-10-CM

## 2017-12-15 DIAGNOSIS — F329 Major depressive disorder, single episode, unspecified: Secondary | ICD-10-CM

## 2017-12-15 NOTE — Progress Notes (Signed)
Name: Megan Dennis   MRN: [id number]    DOB: [birth year]   Date:12/15/2017       Progress Note  Subjective  Chief Complaint  Chief Complaint  Patient presents with  . Follow-up    anxiety and depression    HPI    Megan Dennis is here for a follow up of her depression. She is also requesting evaluation of acute jaw pain today.   When I first met her on 11/15/17, she was maintained on lexapro for her depression. She was requesting to go back on prozac, as she had been on prozac before and felt it improved her mood more than the lexapro. prozac 80m once daily was ordered with instructions to start prozac and stop lexapro.    Today, she says she did stop lexapro and start prozac right after her last visit. She felt very tired at first, which she thought was maybe related to the prozac, but this has improved. She has not felt much change in her mood yet but does feel stable. She would like to continue the prozac at this dosage for now. She denies restlessness, hallucinations, paranoia, thoughts of hurting herself or others. She declines screening scale today due to cost- she saw a charge for this on her last bill.  She also c/o acute right jaw pain. This pain began about 2 days ago. She felt the pain in her upper jaw after waking in the morning 2 days ago and it has persisted since. She feels like she can not chew due to the pain. She does reports a history of TMJ and bruxism. She tried to call a dentist but decided not to go due to cost. She has been taking OTC pain medications with some minimal relief. She denies dizziness, fevers, headaches, neck pain, ear pain, tooth pain, nasal congestion, nausea, vomiting.   Patient Active Problem List   Diagnosis Date Noted  . Fibroma of foot 12/24/2015  . Metatarsalgia of left foot 11/12/2015  . Loss of transverse plantar arch of left foot 11/12/2015  . Depression 04/29/2015  . Situational anxiety 04/29/2015  . Routine general medical examination at a  health care facility 10/15/2014  . TMJ (temporomandibular joint syndrome) 09/16/2014  . Nonallopathic lesion of cervical region 08/22/2014    Past Surgical History:  Procedure Laterality Date  . lipoma removal  1992  . TONSILLECTOMY  2000  . widsom teeth  2002    Family History  Problem Relation Age of Onset  . Hyperlipidemia Maternal Grandmother   . Dementia Paternal Grandfather     Social History   Socioeconomic History  . Marital status: Single    Spouse name: Not on file  . Number of children: 0  . Years of education: 165 . Highest education level: Not on file  Social Needs  . Financial resource strain: Not on file  . Food insecurity - worry: Not on file  . Food insecurity - inability: Not on file  . Transportation needs - medical: Not on file  . Transportation needs - non-medical: Not on file  Occupational History  . Occupation: [employer]  Tobacco Use  . Smoking status: Never Smoker  . Smokeless tobacco: Never Used  Substance and Sexual Activity  . Alcohol use: Yes    Alcohol/week: 0.0 oz    Comment: rarely  . Drug use: No  . Sexual activity: Yes    Birth control/protection: None  Other Topics Concern  . Not  on file  Social History Narrative   Born and raised in [place]. 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 Currently resides in a townhouse. 1 dog. Fun: Sleep when not working.    Denies religious beliefs effecting health care.      Current Outpatient Medications:  .  FLUoxetine (PROZAC) 40 MG capsule, Take 1 capsule (40 mg total) by mouth daily., Disp: 30 capsule, Rfl: 1  No Known Allergies   ROS See HPI  Objective  Vitals:   12/15/17 1133  BP: 120/80  Pulse: 65  Resp: 16  Temp: 98.5 F (36.9 C)  TempSrc: Oral  SpO2: 98%  Weight: 200 lb (90.7 kg)  Height: _0  (1.702 m)   Body mass index is 31.32 kg/m.  Physical Exam Vital signs reviewed. Constitutional: Patient appears well-developed and well-nourished. No distress.  HENT: Head:  Normocephalic and atraumatic Nose: Nose normal. Mouth/Throat: Oropharynx is clear and moist. No oropharyngeal exudate. Normal dentition, no popping or clicking noted to jaw, mandibular movements are normal Eyes: Conjunctivae are normal. No scleral icterus.  Neck: Normal range of motion. Neck supple.  No thyromegaly present.  Cardiovascular: Normal rate, regular rhythm and normal heart sounds.  No BLE edema. Distal pulses intact. Pulmonary/Chest: Effort normal and breath sounds normal. No respiratory distress. Abdominal: Soft. No distension. Musculoskeletal: Normal range of motion, no joint effusions. No gross deformities Neurological: She is alert and oriented to person, place, and time. Coordination, balance, strength, speech and gait are normal.  Skin: Skin is warm and dry. No rash noted. No erythema.  Psychiatric: Patient has a normal mood and affect. Behavior is normal.   PHQ2/9: Depression screen PHQ 2/9 11/15/2017  Decreased Interest 0  Down, Depressed, Hopeless 1  PHQ - 2 Score 1  Altered sleeping 0  Tired, decreased energy 1  Change in appetite 1  Feeling bad or failure about yourself  0  Trouble concentrating 0  Moving slowly or fidgety/restless 0  Suicidal thoughts 0  PHQ-9 Score 3   Assessment & Plan RTC for CPE

## 2017-12-15 NOTE — Patient Instructions (Addendum)
Please return for your annual physical at your convenience.  You can continue aleve for your jaw pain. I would recommend following up with dentist for a nightguard, as this could be related to you grinding your teeth. Also, please practice the jaw exercises I have provided.   It was good to see you. Thanks for letting me take care of you today :)  Jaw Range of Motion Exercises Jaw range of motion exercises are exercises that help your jaw to move better. These exercises can help to prevent:  Difficulty opening your mouth.  Pain in your jaw while it is both open and closed.  What should I be careful of when doing jaw exercises? Make sure that you only do jaw exercises as directed by your health care provider. You should only move your jaw as far as it can go in each direction, if told to do so by your health care provider. Do not move your jaw into positions that cause you any pain. What exercises should I do?  Stick your jaw forward. Hold this position for 1-2 seconds. Allow your jaw to return to its normal position and rest it there for 1-2 seconds. Do this exercise 8 times.  Stand or sit in front of a mirror. Place your tongue on the roof of your mouth, just behind your top teeth. Slowly open and close your jaw, keeping your tongue on the roof of your mouth. While you open and close your mouth, try to keep your jaw from moving toward one side or the other. Repeat this 8 times.  Move your jaw right. Hold this position for 1-2 seconds. Allow your jaw to return to its normal position, and rest it there for 1-2 seconds. Do this exercise 8 times.  Move your jaw left. Hold this position for 1-2 seconds. Allow your jaw to return to its normal position, and rest it there for 1-2 seconds. Do this exercise 8 times.  Open your mouth as far as it is can comfortably go. Hold this position for 1-2 seconds. Then close your mouth and rest for 1-2 seconds. Do this exercise 8 times.  Move your jaw in a  circular motion, starting toward the right (clockwise). Repeat this 8 times.  Move your jaw in a circular motion, starting toward the left (counterclockwise). Repeat this 8 times. Apply moist heat packs or ice packs to your jaw before or after performing your exercises as directed by your health care provider. What else can I do? Avoid the following, if they cause jaw pain or they increase your jaw pain:  Chewing gum.  Clenching your jaw or teeth or keeping tension in your jaw muscles.  Leaning on your jaw, such as resting your jaw in your hand while leaning on a desk.  This information is not intended to replace advice given to you by your health care provider. Make sure you discuss any questions you have with your health care provider. Document Released: 09/15/2008 Document Revised: 03/10/2016 Document Reviewed: 09/03/2014 Elsevier Interactive Patient Education  2018 [employer]  Temporomandibular Joint Syndrome Temporomandibular joint (TMJ) syndrome is a condition that affects the joints between your jaw and your skull. The TMJs are located near your ears and allow your jaw to open and close. These joints and the nearby muscles are involved in all movements of the jaw. People with TMJ syndrome have pain in the area of these joints and muscles. Chewing, biting, or other movements of the jaw can be difficult or  painful. TMJ syndrome can be caused by various things. In many cases, the condition is mild and goes away within a few weeks. For some people, the condition can become a long-term problem. What are the causes? Possible causes of TMJ syndrome include:  Grinding your teeth or clenching your jaw. Some people do this when they are under stress.  Arthritis.  Injury to the jaw.  Head or neck injury.  Teeth or dentures that are not aligned well.  In some cases, the cause of TMJ syndrome may not be known. What are the signs or symptoms? The most common symptom is an aching pain  on the side of the head in the area of the TMJ. Other symptoms may include:  Pain when moving your jaw, such as when chewing or biting.  Being unable to open your jaw all the way.  Making a clicking sound when you open your mouth.  Headache.  Earache.  Neck or shoulder pain.  How is this diagnosed? Diagnosis can usually be made based on your symptoms, your medical history, and a physical exam. Your health care provider may check the range of motion of your jaw. Imaging tests, such as X-rays or an MRI, are sometimes done. You may need to see your dentist to determine if your teeth and jaw are lined up correctly. How is this treated? TMJ syndrome often goes away on its own. If treatment is needed, the options may include:  Eating soft foods and applying ice or heat.  Medicines to relieve pain or inflammation.  Medicines to relax the muscles.  A splint, bite plate, or mouthpiece to prevent teeth grinding or jaw clenching.  Relaxation techniques or counseling to help reduce stress.  Transcutaneous electrical nerve stimulation (TENS). This helps to relieve pain by applying an electrical current through the skin.  Acupuncture. This is sometimes helpful to relieve pain.  Jaw surgery. This is rarely needed.  Follow these instructions at home:  Take medicines only as directed by your health care provider.  Eat a soft diet if you are having trouble chewing.  Apply ice to the painful area. ? Put ice in a plastic bag. ? Place a towel between your skin and the bag. ? Leave the ice on for 20 minutes, 2-3 times a day.  Apply a warm compress to the painful area as directed.  Massage your jaw area and perform any jaw stretching exercises as recommended by your health care provider.  If you were given a mouthpiece or bite plate, wear it as directed.  Avoid foods that require a lot of chewing. Do not chew gum.  Keep all follow-up visits as directed by your health care provider.  This is important. Contact a health care provider if:  You are having trouble eating.  You have new or worsening symptoms. Get help right away if:  Your jaw locks open or closed. This information is not intended to replace advice given to you by your health care provider. Make sure you discuss any questions you have with your health care provider. Document Released: 06/28/2001 Document Revised: 06/02/2016 Document Reviewed: 05/08/2014 Elsevier Interactive Patient Education  [employer].

## 2017-12-15 NOTE — Assessment & Plan Note (Signed)
Discussed use of OTC aleve, TMJ exercises and follow up with dentist for management of bruxism-See AVS for education provided to patient She will follow up if no improvement.

## 2017-12-15 NOTE — Assessment & Plan Note (Signed)
Stable, continue prozac at current dosage She will follow up for worsening symptoms.

## 2017-12-21 MED FILL — FLUoxetine HCL 40 MG CAPS: 40 | 30 days supply | Qty: 30 | Fill #1

## 2018-01-29 ENCOUNTER — Other Ambulatory Visit: Payer: Self-pay | Admitting: Nurse Practitioner

## 2018-01-29 DIAGNOSIS — F32A Depression, unspecified: Secondary | ICD-10-CM

## 2018-01-29 DIAGNOSIS — F329 Major depressive disorder, single episode, unspecified: Secondary | ICD-10-CM

## 2018-01-29 MED FILL — FLUoxetine HCL 40 MG CAPS: 40 | 30 days supply | Qty: 30 | Fill #0

## 2018-03-02 ENCOUNTER — Other Ambulatory Visit (INDEPENDENT_AMBULATORY_CARE_PROVIDER_SITE_OTHER): Payer: 59

## 2018-03-02 ENCOUNTER — Encounter: Payer: Self-pay | Admitting: Nurse Practitioner

## 2018-03-02 ENCOUNTER — Ambulatory Visit (INDEPENDENT_AMBULATORY_CARE_PROVIDER_SITE_OTHER): Payer: 59 | Admitting: Nurse Practitioner

## 2018-03-02 VITALS — BP 120/78 | HR 86 | Temp 99.0°F | Resp 16 | Ht 67.0 in | Wt 193.0 lb

## 2018-03-02 DIAGNOSIS — E669 Obesity, unspecified: Secondary | ICD-10-CM

## 2018-03-02 DIAGNOSIS — Z6830 Body mass index (BMI) 30.0-30.9, adult: Secondary | ICD-10-CM | POA: Diagnosis not present

## 2018-03-02 DIAGNOSIS — Z13220 Encounter for screening for lipoid disorders: Secondary | ICD-10-CM

## 2018-03-02 DIAGNOSIS — Z0000 Encounter for general adult medical examination without abnormal findings: Secondary | ICD-10-CM

## 2018-03-02 DIAGNOSIS — F32A Depression, unspecified: Secondary | ICD-10-CM

## 2018-03-02 DIAGNOSIS — F329 Major depressive disorder, single episode, unspecified: Secondary | ICD-10-CM

## 2018-03-02 DIAGNOSIS — Z23 Encounter for immunization: Secondary | ICD-10-CM | POA: Diagnosis not present

## 2018-03-02 LAB — HEMOGLOBIN A1C: Hgb A1c MFr Bld: 5.4 % (ref 4.6–6.5)

## 2018-03-02 LAB — LIPID PANEL
CHOL/HDL RATIO: 4
Cholesterol: 190 mg/dL (ref 0–200)
HDL: 52.1 mg/dL (ref >39.00)
LDL CALC: 101 mg/dL — AB (ref 0–99)
NONHDL: 137.8
Triglycerides: 185 mg/dL — ABNORMAL HIGH (ref 0.0–149.0)
VLDL: 37 mg/dL (ref 0.0–40.0)

## 2018-03-02 NOTE — Assessment & Plan Note (Addendum)
-  USPSTF grade A and B recommendations reviewed with patient; age-appropriate recommendations, preventive care, screening tests, etc discussed and encouraged; healthy living and sunscreen use encouraged; see AVS for patient education given to patient. Declines advanced directives packet -Discussed importance of 150 minutes of physical activity weekly, eat 6 servings of fruit/vegetables daily and drink plenty of water and avoid sweet beverages.  -Follow up and care instructions discussed and provided in AVS.  -Reviewed Health Maintenance:  -PAP up to date per patient report, our office will request GYN records for chart -Need for Tdap vaccination- Tdap vaccine greater than or equal to 7yo IM -declines HIV screening  Routine general medical examination at a health care facility- Lipid panel; Future  BMI 30.0-30.9,adult- Hemoglobin A1c; Future

## 2018-03-02 NOTE — Progress Notes (Signed)
Name: Megan Dennis   MRN: [id number]    DOB: 04-Aug-1986   Date:03/02/2018       Progress Note  Subjective  Chief Complaint  Chief Complaint  Patient presents with  . CPE    not fasting    HPI  Patient presents for annual CPE.  Diet: portioned meals and vegetables She wanted to go to weight management but didn't qualify due BMI too low Exercise: 3 times/week workout videos, yoga twice a week, walks dog  USPSTF grade A and B recommendations  Depression: maintained on prozac 40 daily, which she feels helps her mood No thoughts of hurting self or others Depression screen [facility] 2/9 11/15/2017  Decreased Interest 0  Down, Depressed, Hopeless 1  PHQ - 2 Score 1  Altered sleeping 0  Tired, decreased energy 1  Change in appetite 1  Feeling bad or failure about yourself  0  Trouble concentrating 0  Moving slowly or fidgety/restless 0  Suicidal thoughts 0  PHQ-9 Score 3   Hypertension: BP Readings from Last 3 Encounters:  03/02/18 120/78  12/15/17 120/80  11/15/17 122/80   Obesity: Wt Readings from Last 3 Encounters:  03/02/18 193 lb (87.5 kg)  12/15/17 200 lb (90.7 kg)  11/15/17 200 lb (90.7 kg)   BMI Readings from Last 3 Encounters:  03/02/18 30.23 kg/m  12/15/17 31.32 kg/m  11/15/17 31.32 kg/m    Alcohol: social drink Tobacco use: no, never  HIV: declines screening STD testing and prevention (chl/gon/syphilis): no concerns, declines screening  Intimate partner violence: feels safe Vaccinations: TDAP today  Advanced Care Planning: A voluntary discussion about advance care planning including the explanation and discussion of advance directives.  Discussed health care proxy and Living will, and the patient DOES NOT  have a living will at present time. If patient does have living will, I have requested they bring this to the clinic to be scanned in to their chart.  Cervical cancer screening: PAP up to date, follows with GYN for routine womens health  care  Lipids:  Lab Results  Component Value Date   CHOL 182 04/29/2015   Lab Results  Component Value Date   HDL 49.70 04/29/2015   Lab Results  Component Value Date   LDLCALC 119 (H) 04/29/2015   Lab Results  Component Value Date   TRIG 65.0 04/29/2015   Lab Results  Component Value Date   CHOLHDL 4 04/29/2015   No results found for: LDLDIRECT  Glucose:  Glucose, Bld  Date Value Ref Range Status  11/15/2017 91 70 - 99 mg/dL Final  05/27/2015 111 (H) 65 - 99 mg/dL Final  04/29/2015 88 70 - 99 mg/dL Final    Skin cancer: no concerning moles or lesions, wears sunscreen, sees dermatology for annual skin check  Aspirin: not indicated ECG: not indicated   Patient Active Problem List   Diagnosis Date Noted  . Fibroma of foot 12/24/2015  . Metatarsalgia of left foot 11/12/2015  . Loss of transverse plantar arch of left foot 11/12/2015  . Depression 04/29/2015  . Situational anxiety 04/29/2015  . Routine general medical examination at a health care facility 10/15/2014  . TMJ (temporomandibular joint syndrome) 09/16/2014  . Nonallopathic lesion of cervical region 08/22/2014    Past Surgical History:  Procedure Laterality Date  . lipoma removal  1992  . TONSILLECTOMY  2000  . widsom teeth  2002    Family History  Problem Relation Age of Onset  . Hyperlipidemia Maternal Grandmother   .  Dementia Paternal Grandfather     Social History   Socioeconomic History  . Marital status: Single    Spouse name: Not on file  . Number of children: 0  . Years of education: 50  . Highest education level: Not on file  Occupational History  . Occupation: Art therapist: [employer]  Social Needs  . Financial resource strain: Not on file  . Food insecurity:    Worry: Not on file    Inability: Not on file  . Transportation needs:    Medical: Not on file    Non-medical: Not on file  Tobacco Use  . Smoking status: Never Smoker  . Smokeless tobacco: Never Used   Substance and Sexual Activity  . Alcohol use: Yes    Alcohol/week: 0.0 oz    Comment: rarely  . Drug use: No  . Sexual activity: Yes    Birth control/protection: None  Lifestyle  . Physical activity:    Days per week: Not on file    Minutes per session: Not on file  . Stress: Not on file  Relationships  . Social connections:    Talks on phone: Not on file    Gets together: Not on file    Attends religious service: Not on file    Active member of club or organization: Not on file    Attends meetings of clubs or organizations: Not on file    Relationship status: Not on file  . Intimate partner violence:    Fear of current or ex partner: Not on file    Emotionally abused: Not on file    Physically abused: Not on file    Forced sexual activity: Not on file  Other Topics Concern  . Not on file  Social History Narrative   Born and raised in [place]. 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 Fun: Sleep when not working.    Denies religious beliefs effecting health care.      Current Outpatient Medications:  .  FLUoxetine (PROZAC) 40 MG capsule, TAKE 1 CAPSULE BY MOUTH DAILY., Disp: 30 capsule, Rfl: 1  No Known Allergies   ROS  No other specific complaints in a complete review of systems (except as listed in HPI above).   Objective  Vitals:   03/02/18 1501  BP: 120/78  Pulse: 86  Resp: 16  Temp: 99 F (37.2 C)  TempSrc: Oral  SpO2: 96%  Weight: 193 lb (87.5 kg)  Height: 5\' 7"  (1.702 m)    Body mass index is 30.23 kg/m.  Physical Exam Vital signs reviewed. Constitutional: Patient appears well-developed and well-nourished. No distress.  HENT: Head: Normocephalic and atraumatic. Ears: B TMs ok, no erythema or effusion; Nose: Nose normal. Mouth/Throat: Oropharynx is clear and moist. No oropharyngeal exudate.  Eyes: Conjunctivae and EOM are normal. Pupils are equal, round, and reactive to light. No scleral icterus.  Neck: Normal range of motion.  Neck supple. No cervical adenopathy. No thyromegaly present.  Cardiovascular: Normal rate, regular rhythm and normal heart sounds.  No murmur heard. No BLE edema. Pulmonary/Chest: Effort normal and breath sounds normal. No respiratory distress. Abdominal: Soft. Bowel sounds are normal, no distension. There is no tenderness. no masses FEMALE GENITALIA:  Deferred to GYN Musculoskeletal: Normal range of motion. No gross deformities Neurological: She is alert and oriented to person, place, and time. No cranial nerve deficit. Coordination, balance, strength, speech and gait are normal.  Skin: Skin is warm and dry. No  rash noted. No erythema.  Psychiatric: Patient has a normal mood and affect. behavior is normal. Judgment and thought content normal.   PHQ2/9: Depression screen PHQ 2/9 11/15/2017  Decreased Interest 0  Down, Depressed, Hopeless 1  PHQ - 2 Score 1  Altered sleeping 0  Tired, decreased energy 1  Change in appetite 1  Feeling bad or failure about yourself  0  Trouble concentrating 0  Moving slowly or fidgety/restless 0  Suicidal thoughts 0  PHQ-9 Score 3   Assessment & Plan RTC in 1 year for CPE CBC w/diff, CMET, TSH, Vitamin D on 11/15/2017 WNL

## 2018-03-02 NOTE — Assessment & Plan Note (Signed)
Stable, continue prozac. F/U for new, worsening symptoms

## 2018-03-02 NOTE — Patient Instructions (Addendum)
Please head downstairs for lab work/x-rays. If any of your test results are critically abnormal, you will be contacted right away. Your results may be released to your MyChart for viewing before I am able to provide you with my response. I will contact you within a week about your test results and any recommendations for abnormalities.  I will plan to see you back in 1 year for your annual physical, or sooner if you need me.  It was good to see you. Thanks for letting me take care of you today :)   Health Maintenance, Female Adopting a healthy lifestyle and getting preventive care can go a long way to promote health and wellness. Talk with your health care provider about what schedule of regular examinations is right for you. This is a good chance for you to check in with your provider about disease prevention and staying healthy. In between checkups, there are plenty of things you can do on your own. Experts have done a lot of research about which lifestyle changes and preventive measures are most likely to keep you healthy. Ask your health care provider for more information. Weight and diet Eat a healthy diet  Be sure to include plenty of vegetables, fruits, low-fat dairy products, and lean protein.  Do not eat a lot of foods high in solid fats, added sugars, or salt.  Get regular exercise. This is one of the most important things you can do for your health. ? Most adults should exercise for at least 150 minutes each week. The exercise should increase your heart rate and make you sweat (moderate-intensity exercise). ? Most adults should also do strengthening exercises at least twice a week. This is in addition to the moderate-intensity exercise.  Maintain a healthy weight  Body mass index (BMI) is a measurement that can be used to identify possible weight problems. It estimates body fat based on height and weight. Your health care provider can help determine your BMI and help you achieve  or maintain a healthy weight.  For females 71 years of age and older: ? A BMI below 18.5 is considered underweight. ? A BMI of 18.5 to 24.9 is normal. ? A BMI of 25 to 29.9 is considered overweight. ? A BMI of 30 and above is considered obese.  Watch levels of cholesterol and blood lipids  You should start having your blood tested for lipids and cholesterol at 33 years of age, then have this test every 5 years.  You may need to have your cholesterol levels checked more often if: ? Your lipid or cholesterol levels are high. ? You are older than 33 years of age. ? You are at high risk for heart disease.  Cancer screening Lung Cancer  Lung cancer screening is recommended for adults 40-29 years old who are at high risk for lung cancer because of a history of smoking.  A yearly low-dose CT scan of the lungs is recommended for people who: ? Currently smoke. ? Have quit within the past 15 years. ? Have at least a 30-pack-year history of smoking. A pack year is smoking an average of one pack of cigarettes a day for 1 year.  Yearly screening should continue until it has been 15 years since you quit.  Yearly screening should stop if you develop a health problem that would prevent you from having lung cancer treatment.  Breast Cancer  Practice breast self-awareness. This means understanding how your breasts normally appear and feel.  It also  means doing regular breast self-exams. Let your health care provider know about any changes, no matter how small.  If you are in your 20s or 30s, you should have a clinical breast exam (CBE) by a health care provider every 1-3 years as part of a regular health exam.  If you are 22 or older, have a CBE every year. Also consider having a breast X-ray (mammogram) every year.  If you have a family history of breast cancer, talk to your health care provider about genetic screening.  If you are at high risk for breast cancer, talk to your health care  provider about having an MRI and a mammogram every year.  Breast cancer gene (BRCA) assessment is recommended for women who have family members with BRCA-related cancers. BRCA-related cancers include: ? Breast. ? Ovarian. ? Tubal. ? Peritoneal cancers.  Results of the assessment will determine the need for genetic counseling and BRCA1 and BRCA2 testing.  Cervical Cancer Your health care provider may recommend that you be screened regularly for cancer of the pelvic organs (ovaries, uterus, and vagina). This screening involves a pelvic examination, including checking for microscopic changes to the surface of your cervix (Pap test). You may be encouraged to have this screening done every 3 years, beginning at age 38.  For women ages 22-65, health care providers may recommend pelvic exams and Pap testing every 3 years, or they may recommend the Pap and pelvic exam, combined with testing for human papilloma virus (HPV), every 5 years. Some types of HPV increase your risk of cervical cancer. Testing for HPV may also be done on women of any age with unclear Pap test results.  Other health care providers may not recommend any screening for nonpregnant women who are considered low risk for pelvic cancer and who do not have symptoms. Ask your health care provider if a screening pelvic exam is right for you.  If you have had past treatment for cervical cancer or a condition that could lead to cancer, you need Pap tests and screening for cancer for at least 20 years after your treatment. If Pap tests have been discontinued, your risk factors (such as having a new sexual partner) need to be reassessed to determine if screening should resume. Some women have medical problems that increase the chance of getting cervical cancer. In these cases, your health care provider may recommend more frequent screening and Pap tests.  Colorectal Cancer  This type of cancer can be detected and often prevented.  Routine  colorectal cancer screening usually begins at 33 years of age and continues through 33 years of age.  Your health care provider may recommend screening at an earlier age if you have risk factors for colon cancer.  Your health care provider may also recommend using home test kits to check for hidden blood in the stool.  A small camera at the end of a tube can be used to examine your colon directly (sigmoidoscopy or colonoscopy). This is done to check for the earliest forms of colorectal cancer.  Routine screening usually begins at age 79.  Direct examination of the colon should be repeated every 5-10 years through 33 years of age. However, you may need to be screened more often if early forms of precancerous polyps or small growths are found.  Skin Cancer  Check your skin from head to toe regularly.  Tell your health care provider about any new moles or changes in moles, especially if there is a change in  a mole's shape or color.  Also tell your health care provider if you have a mole that is larger than the size of a pencil eraser.  Always use sunscreen. Apply sunscreen liberally and repeatedly throughout the day.  Protect yourself by wearing long sleeves, pants, a wide-brimmed hat, and sunglasses whenever you are outside.  Heart disease, diabetes, and high blood pressure  High blood pressure causes heart disease and increases the risk of stroke. High blood pressure is more likely to develop in: ? People who have blood pressure in the high end of the normal range (130-139/85-89 mm Hg). ? People who are overweight or obese. ? People who are African American.  If you are 50-60 years of age, have your blood pressure checked every 3-5 years. If you are 67 years of age or older, have your blood pressure checked every year. You should have your blood pressure measured twice-once when you are at a hospital or clinic, and once when you are not at a hospital or clinic. Record the average of the  two measurements. To check your blood pressure when you are not at a hospital or clinic, you can use: ? An automated blood pressure machine at a pharmacy. ? A home blood pressure monitor.  If you are between 57 years and 77 years old, ask your health care provider if you should take aspirin to prevent strokes.  Have regular diabetes screenings. This involves taking a blood sample to check your fasting blood sugar level. ? If you are at a normal weight and have a low risk for diabetes, have this test once every three years after 33 years of age. ? If you are overweight and have a high risk for diabetes, consider being tested at a younger age or more often. Preventing infection Hepatitis B  If you have a higher risk for hepatitis B, you should be screened for this virus. You are considered at high risk for hepatitis B if: ? You were born in a country where hepatitis B is common. Ask your health care provider which countries are considered high risk. ? Your parents were born in a high-risk country, and you have not been immunized against hepatitis B (hepatitis B vaccine). ? You have HIV or AIDS. ? You use needles to inject street drugs. ? You live with someone who has hepatitis B. ? You have had sex with someone who has hepatitis B. ? You get hemodialysis treatment. ? You take certain medicines for conditions, including cancer, organ transplantation, and autoimmune conditions.  Hepatitis C  Blood testing is recommended for: ? Everyone born from [birth year] through 1966. ? Anyone with known risk factors for hepatitis C.  Sexually transmitted infections (STIs)  You should be screened for sexually transmitted infections (STIs) including gonorrhea and chlamydia if: ? You are sexually active and are younger than 33 years of age. ? You are older than 33 years of age and your health care provider tells you that you are at risk for this type of infection. ? Your sexual activity has changed since you  were last screened and you are at an increased risk for chlamydia or gonorrhea. Ask your health care provider if you are at risk.  If you do not have HIV, but are at risk, it may be recommended that you take a prescription medicine daily to prevent HIV infection. This is called pre-exposure prophylaxis (PrEP). You are considered at risk if: ? You are sexually active and do not regularly use condoms  or know the HIV status of your partner(s). ? You take drugs by injection. ? You are sexually active with a partner who has HIV.  Talk with your health care provider about whether you are at high risk of being infected with HIV. If you choose to begin PrEP, you should first be tested for HIV. You should then be tested every 3 months for as long as you are taking PrEP. Pregnancy  If you are premenopausal and you may become pregnant, ask your health care provider about preconception counseling.  If you may become pregnant, take 400 to 800 micrograms (mcg) of folic acid every day.  If you want to prevent pregnancy, talk to your health care provider about birth control (contraception). Osteoporosis and menopause  Osteoporosis is a disease in which the bones lose minerals and strength with aging. This can result in serious bone fractures. Your risk for osteoporosis can be identified using a bone density scan.  If you are 51 years of age or older, or if you are at risk for osteoporosis and fractures, ask your health care provider if you should be screened.  Ask your health care provider whether you should take a calcium or vitamin D supplement to lower your risk for osteoporosis.  Menopause may have certain physical symptoms and risks.  Hormone replacement therapy may reduce some of these symptoms and risks. Talk to your health care provider about whether hormone replacement therapy is right for you. Follow these instructions at home:  Schedule regular health, dental, and eye exams.  Stay current  with your immunizations.  Do not use any tobacco products including cigarettes, chewing tobacco, or electronic cigarettes.  If you are pregnant, do not drink alcohol.  If you are breastfeeding, limit how much and how often you drink alcohol.  Limit alcohol intake to no more than 1 drink per day for nonpregnant women. One drink equals 12 ounces of beer, 5 ounces of wine, or 1 ounces of hard liquor.  Do not use street drugs.  Do not share needles.  Ask your health care provider for help if you need support or information about quitting drugs.  Tell your health care provider if you often feel depressed.  Tell your health care provider if you have ever been abused or do not feel safe at home. This information is not intended to replace advice given to you by your health care provider. Make sure you discuss any questions you have with your health care provider. Document Released: 04/18/2011 Document Revised: 03/10/2016 Document Reviewed: 07/07/2015 Elsevier Interactive Patient Education  [employer].

## 2018-03-22 MED FILL — FLUoxetine HCL 40 MG CAPS: 40 | 30 days supply | Qty: 30 | Fill #1

## 2018-05-17 ENCOUNTER — Other Ambulatory Visit: Payer: Self-pay | Admitting: Nurse Practitioner

## 2018-05-17 DIAGNOSIS — F32A Depression, unspecified: Secondary | ICD-10-CM

## 2018-05-17 DIAGNOSIS — F329 Major depressive disorder, single episode, unspecified: Secondary | ICD-10-CM

## 2018-05-17 MED FILL — FLUoxetine HCL 40 MG CAPS: 40 | 30 days supply | Qty: 30 | Fill #0

## 2018-06-19 MED FILL — FLUoxetine HCL 40 MG CAPS: 40 | 30 days supply | Qty: 30 | Fill #1

## 2018-08-23 ENCOUNTER — Other Ambulatory Visit: Payer: Self-pay | Admitting: Nurse Practitioner

## 2018-08-23 DIAGNOSIS — F329 Major depressive disorder, single episode, unspecified: Secondary | ICD-10-CM

## 2018-08-23 DIAGNOSIS — F32A Depression, unspecified: Secondary | ICD-10-CM

## 2018-08-23 MED FILL — FLUoxetine HCL 40 MG CAPS: 40 | 30 days supply | Qty: 30 | Fill #0

## 2018-08-29 ENCOUNTER — Other Ambulatory Visit: Payer: Self-pay

## 2018-08-29 ENCOUNTER — Ambulatory Visit (HOSPITAL_COMMUNITY)
Admission: EM | Admit: 2018-08-29 | Discharge: 2018-08-29 | Disposition: A | Discharge status: Home / Self Care | Payer: 59 | Attending: Family Medicine | Admitting: Family Medicine

## 2018-08-29 ENCOUNTER — Ambulatory Visit (HOSPITAL_COMMUNITY)
Admit: 2018-08-29 | Discharge: 2018-08-29 | Disposition: A | Discharge status: Home / Self Care | Payer: 59 | Source: Ambulatory Visit | Attending: Family Medicine | Admitting: Family Medicine

## 2018-08-29 ENCOUNTER — Encounter (HOSPITAL_COMMUNITY): Payer: Self-pay | Admitting: Emergency Medicine

## 2018-08-29 DIAGNOSIS — N83209 Unspecified ovarian cyst, unspecified side: Secondary | ICD-10-CM | POA: Diagnosis not present

## 2018-08-29 DIAGNOSIS — R102 Pelvic and perineal pain: Secondary | ICD-10-CM | POA: Insufficient documentation

## 2018-08-29 DIAGNOSIS — R110 Nausea: Secondary | ICD-10-CM | POA: Diagnosis present

## 2018-08-29 DIAGNOSIS — R9389 Abnormal findings on diagnostic imaging of other specified body structures: Secondary | ICD-10-CM

## 2018-08-29 DIAGNOSIS — N83291 Other ovarian cyst, right side: Secondary | ICD-10-CM | POA: Diagnosis not present

## 2018-08-29 LAB — CBC WITH DIFFERENTIAL/PLATELET
Abs Immature Granulocytes: 0.02 10*3/uL (ref 0.00–0.07)
BASOS ABS: 0.1 10*3/uL (ref 0.0–0.1)
BASOS PCT: 1 %
EOS ABS: 0.2 10*3/uL (ref 0.0–0.5)
Eosinophils Relative: 3 %
HCT: 45.5 % (ref 36.0–46.0)
Hemoglobin: 14.6 g/dL (ref 12.0–15.0)
Immature Granulocytes: 0 %
Lymphocytes Relative: 27 %
Lymphs Abs: 2.3 10*3/uL (ref 0.7–4.0)
MCH: 30.1 pg (ref 26.0–34.0)
MCHC: 32.1 g/dL (ref 30.0–36.0)
MCV: 93.8 fL (ref 80.0–100.0)
Monocytes Absolute: 0.6 10*3/uL (ref 0.1–1.0)
Monocytes Relative: 8 %
NEUTROS PCT: 61 %
NRBC: 0 % (ref 0.0–0.2)
Neutro Abs: 5.2 10*3/uL (ref 1.7–7.7)
PLATELETS: 334 10*3/uL (ref 150–400)
RBC: 4.85 MIL/uL (ref 3.87–5.11)
RDW: 11.9 % (ref 11.5–15.5)
WBC: 8.4 10*3/uL (ref 4.0–10.5)

## 2018-08-29 LAB — POCT I-STAT, CHEM 8
BUN: 21 mg/dL — ABNORMAL HIGH (ref 6–20)
Calcium, Ion: 1.24 mmol/L (ref 1.15–1.40)
Chloride: 105 mmol/L (ref 98–111)
Creatinine, Ser: 0.7 mg/dL (ref 0.44–1.00)
GLUCOSE: 87 mg/dL (ref 70–99)
HEMATOCRIT: 45 % (ref 36.0–46.0)
HEMOGLOBIN: 15.3 g/dL — AB (ref 12.0–15.0)
Potassium: 4.3 mmol/L (ref 3.5–5.1)
Sodium: 140 mmol/L (ref 135–145)
TCO2: 26 mmol/L (ref 22–32)

## 2018-08-29 LAB — POCT URINALYSIS DIP (DEVICE)
BILIRUBIN URINE: NEGATIVE
Glucose, UA: NEGATIVE mg/dL
Hgb urine dipstick: NEGATIVE
Ketones, ur: NEGATIVE mg/dL
LEUKOCYTES UA: NEGATIVE
NITRITE: NEGATIVE
Protein, ur: NEGATIVE mg/dL
Specific Gravity, Urine: 1.02 (ref 1.005–1.030)
Urobilinogen, UA: 0.2 mg/dL (ref 0.0–1.0)
pH: 7 (ref 5.0–8.0)

## 2018-08-29 LAB — POCT PREGNANCY, URINE: PREG TEST UR: NEGATIVE

## 2018-08-29 MED ORDER — KETOROLAC TROMETHAMINE 60 MG/2ML IM SOLN
60.0000 mg | Freq: Once | INTRAMUSCULAR | Status: AC
  Administered 2018-08-29: 60 mg via INTRAMUSCULAR

## 2018-08-29 MED ORDER — KETOROLAC TROMETHAMINE 60 MG/2ML IM SOLN
INTRAMUSCULAR | Status: AC
  Filled 2018-08-29: qty 2

## 2018-08-29 NOTE — ED Triage Notes (Signed)
Pt lower back and suprapubic aching and cramping pain that started two days ago.  She denies any urinary symptoms and no fever.

## 2018-08-29 NOTE — Discharge Instructions (Addendum)
Meds ordered this encounter  Medications   ketorolac (TORADOL) injection 60 mg    

## 2018-08-29 NOTE — ED Provider Notes (Signed)
[place]   [id number] 08/29/18 Arrival Time: 3893  ASSESSMENT & PLAN:  1. Pelvic pain in female   2. Hemorrhagic ovarian cyst   No ovarian torsion. Low suspicion of other intra-abdominal problem.  Imaging: US Pelvic Complete W Transvaginal And Torsion R/O  Result Date: 08/29/2018 IMPRESSION: Normal appearing uterus and LEFT ovary. Suspected hemorrhagic cyst within RIGHT ovary 2.8 cm greatest size, recommendation below. Short-interval follow up ultrasound in 6-12 weeks is recommended, preferably during the week following the patient's normal menses. Electronically Signed   By: Lavonia Dana M.D.   On: 08/29/2018 12:41  Informed Ms Bleicher by phone of U/S results. She plans to schedule f/u with her gynecologist. May f/u here as needed. OTC NSAIDs if needed. To ED if abrupt worsening.  Reviewed expectations re: course of current medical issues. Questions answered. Outlined signs and symptoms indicating need for more acute intervention. Patient verbalized understanding. After Visit Summary given.  SUBJECTIVE:  Megan Dennis is a 32 y.o. female who presents with complaint of fairly persistent non-positional pelvic discomfort. Onset gradual, five days ago. Location: pelvis and lower back without radiation. Described as cramping, "like menstrual cramps". Symptoms are slightly worsening since beginning. Aggravating factors: none reported. Alleviating factors: none reported. Associated symptoms: mild nausea without emesis. She denies anorexia, arthralgias, belching, constipation, diarrhea, fever and sweats. Appetite: normal. PO intake: normal. Eating does not affect pain. Ambulatory without assistance. Urinary symptoms: none. Last bowel movement yesterday without blood; normal consistency. Not currently sexually active. No vaginal discharge or bleeding. OTC treatment: none.  Patient's last menstrual period was 08/08/2018 (approximate). Usually regular.  Past Surgical History:    Procedure Laterality Date  . lipoma removal  1992  . TONSILLECTOMY  2000  . widsom teeth  2002   ROS: As per HPI. All other systems negative.  OBJECTIVE:  Vitals:   08/29/18 1000  BP: 112/81  Pulse: 92  Temp: 98.6 F (37 C)  TempSrc: Oral  SpO2: 96%    General appearance: alert; no distress but appears uncomfortable while on exam table Lungs: clear to auscultation bilaterally; unlabored Heart: regular rate and rhythm Abdomen: soft; non-distended; mild tenderness over mid lower abdomen/pelvis (poorly localized); bowel sounds present; no masses or organomegaly; no guarding or rebound tenderness GU: deferred Back: no CVA tenderness but does describe some discomfort over R and L upper buttock; no midline tenderness; FROM at hips Extremities: no edema; symmetrical with no gross deformities Skin: warm and dry Neurologic: normal gait Psychological: alert and cooperative; normal mood and affect  Labs: Results for orders placed or performed during the hospital encounter of 08/29/18  CBC with Differential  Result Value Ref Range   WBC 8.4 4.0 - 10.5 K/uL   RBC 4.85 3.87 - 5.11 MIL/uL   Hemoglobin 14.6 12.0 - 15.0 g/dL   HCT 45.5 36.0 - 46.0 %   MCV 93.8 80.0 - 100.0 fL   MCH 30.1 26.0 - 34.0 pg   MCHC 32.1 30.0 - 36.0 g/dL   RDW 11.9 11.5 - 15.5 %   Platelets 334 150 - 400 K/uL   nRBC 0.0 0.0 - 0.2 %   Neutrophils Relative % 61 %   Neutro Abs 5.2 1.7 - 7.7 K/uL   Lymphocytes Relative 27 %   Lymphs Abs 2.3 0.7 - 4.0 K/uL   Monocytes Relative 8 %   Monocytes Absolute 0.6 0.1 - 1.0 K/uL   Eosinophils Relative 3 %   Eosinophils Absolute 0.2 0.0 - 0.5 K/uL  Basophils Relative 1 %   Basophils Absolute 0.1 0.0 - 0.1 K/uL   Immature Granulocytes 0 %   Abs Immature Granulocytes 0.02 0.00 - 0.07 K/uL  POCT urinalysis dip (device)  Result Value Ref Range   Glucose, UA NEGATIVE NEGATIVE mg/dL   Bilirubin Urine NEGATIVE NEGATIVE   Ketones, ur NEGATIVE NEGATIVE mg/dL   Specific  Gravity, Urine 1.020 1.005 - 1.030   Hgb urine dipstick NEGATIVE NEGATIVE   pH 7.0 5.0 - 8.0   Protein, ur NEGATIVE NEGATIVE mg/dL   Urobilinogen, UA 0.2 0.0 - 1.0 mg/dL   Nitrite NEGATIVE NEGATIVE   Leukocytes, UA NEGATIVE NEGATIVE  Pregnancy, urine POC  Result Value Ref Range   Preg Test, Ur NEGATIVE NEGATIVE  I-STAT, chem 8  Result Value Ref Range   Sodium 140 135 - 145 mmol/L   Potassium 4.3 3.5 - 5.1 mmol/L   Chloride 105 98 - 111 mmol/L   BUN 21 (H) 6 - 20 mg/dL   Creatinine, Ser 0.70 0.44 - 1.00 mg/dL   Glucose, Bld 87 70 - 99 mg/dL   Calcium, Ion 1.24 1.15 - 1.40 mmol/L   TCO2 26 22 - 32 mmol/L   Hemoglobin 15.3 (H) 12.0 - 15.0 g/dL   HCT 45.0 36.0 - 46.0 %   Labs Reviewed  POCT URINALYSIS DIP (DEVICE)  POCT PREGNANCY, URINE    No Known Allergies                                             Past Medical History:  Diagnosis Date  . Asthma    Exercised Induced, and as a child  . Depression   . Migraines    Social History   Socioeconomic History  . Marital status: Single    Spouse name: Not on file  . Number of children: 0  . Years of education: 61  . Highest education level: Not on file  Occupational History  . Occupation: Art therapist: [employer]  Social Needs  . Financial resource strain: Not on file  . Food insecurity:    Worry: Not on file    Inability: Not on file  . Transportation needs:    Medical: Not on file    Non-medical: Not on file  Tobacco Use  . Smoking status: Never Smoker  . Smokeless tobacco: Never Used  Substance and Sexual Activity  . Alcohol use: Yes    Alcohol/week: 0.0 standard drinks    Comment: rarely  . Drug use: No  . Sexual activity: Yes    Birth control/protection: None  Lifestyle  . Physical activity:    Days per week: Not on file    Minutes per session: Not on file  . Stress: Not on file  Relationships  . Social connections:    Talks on phone: Not on file    Gets together: Not on file    Attends  religious service: Not on file    Active member of club or organization: Not on file    Attends meetings of clubs or organizations: Not on file    Relationship status: Not on file  . Intimate partner violence:    Fear of current or ex partner: Not on file    Emotionally abused: Not on file    Physically abused: Not on file    Forced sexual activity: Not on file  Other Topics Concern  . Not on file  Social History Narrative   Born and raised in [place]. 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 Fun: Sleep when not working.    Denies religious beliefs effecting health care.    Family History  Problem Relation Age of Onset  . Hyperlipidemia Maternal Grandmother   . Dementia Paternal Reymundo Poll, MD 08/29/18 [phone]

## 2018-09-05 DIAGNOSIS — N83209 Unspecified ovarian cyst, unspecified side: Secondary | ICD-10-CM | POA: Diagnosis not present

## 2018-09-12 ENCOUNTER — Emergency Department (HOSPITAL_COMMUNITY)
Admission: EM | Admit: 2018-09-12 | Discharge: 2018-09-13 | Disposition: A | Discharge status: Home / Self Care | Payer: 59 | Attending: Emergency Medicine | Admitting: Emergency Medicine

## 2018-09-12 ENCOUNTER — Encounter (HOSPITAL_COMMUNITY): Payer: Self-pay

## 2018-09-12 ENCOUNTER — Emergency Department (HOSPITAL_COMMUNITY): Payer: 59

## 2018-09-12 DIAGNOSIS — F329 Major depressive disorder, single episode, unspecified: Secondary | ICD-10-CM | POA: Insufficient documentation

## 2018-09-12 DIAGNOSIS — Z79899 Other long term (current) drug therapy: Secondary | ICD-10-CM | POA: Diagnosis not present

## 2018-09-12 DIAGNOSIS — R1011 Right upper quadrant pain: Secondary | ICD-10-CM | POA: Insufficient documentation

## 2018-09-12 DIAGNOSIS — J45909 Unspecified asthma, uncomplicated: Secondary | ICD-10-CM | POA: Insufficient documentation

## 2018-09-12 LAB — COMPREHENSIVE METABOLIC PANEL
ALT: 19 U/L (ref 0–44)
AST: 20 U/L (ref 15–41)
Albumin: 3.7 g/dL (ref 3.5–5.0)
Alkaline Phosphatase: 66 U/L (ref 38–126)
Anion gap: 8 (ref 5–15)
BUN: 19 mg/dL (ref 6–20)
CO2: 24 mmol/L (ref 22–32)
Calcium: 8.5 mg/dL — ABNORMAL LOW (ref 8.9–10.3)
Chloride: 108 mmol/L (ref 98–111)
Creatinine, Ser: 0.72 mg/dL (ref 0.44–1.00)
GFR calc Af Amer: >60 mL/min (ref >60)
GFR calc non Af Amer: >60 mL/min (ref >60)
Glucose, Bld: 161 mg/dL — ABNORMAL HIGH (ref 70–99)
Potassium: 4 mmol/L (ref 3.5–5.1)
Sodium: 140 mmol/L (ref 135–145)
Total Bilirubin: 0.6 mg/dL (ref 0.3–1.2)
Total Protein: 6.6 g/dL (ref 6.5–8.1)

## 2018-09-12 LAB — I-STAT BETA HCG BLOOD, ED (MC, WL, AP ONLY): I-stat hCG, quantitative: <5 m[IU]/mL (ref <5)

## 2018-09-12 LAB — CBC
HCT: 41.4 % (ref 36.0–46.0)
Hemoglobin: 13.6 g/dL (ref 12.0–15.0)
MCH: 31 pg (ref 26.0–34.0)
MCHC: 32.9 g/dL (ref 30.0–36.0)
MCV: 94.3 fL (ref 80.0–100.0)
Platelets: 368 10*3/uL (ref 150–400)
RBC: 4.39 MIL/uL (ref 3.87–5.11)
RDW: 12.1 % (ref 11.5–15.5)
WBC: 9.9 10*3/uL (ref 4.0–10.5)
nRBC: 0 % (ref 0.0–0.2)

## 2018-09-12 LAB — URINALYSIS, ROUTINE W REFLEX MICROSCOPIC
Bilirubin Urine: NEGATIVE
Glucose, UA: NEGATIVE mg/dL
Hgb urine dipstick: NEGATIVE
Ketones, ur: NEGATIVE mg/dL
Leukocytes, UA: NEGATIVE
Nitrite: NEGATIVE
Protein, ur: NEGATIVE mg/dL
Specific Gravity, Urine: 1.034 — ABNORMAL HIGH (ref 1.005–1.030)
pH: 5 (ref 5.0–8.0)

## 2018-09-12 LAB — LIPASE, BLOOD: Lipase: 28 U/L (ref 11–51)

## 2018-09-12 MED ORDER — ALUM & MAG HYDROXIDE-SIMETH 200-200-20 MG/5ML PO SUSP
30.0000 mL | Freq: Once | ORAL | Status: AC
  Administered 2018-09-12: 30 mL via ORAL
  Filled 2018-09-12: qty 30

## 2018-09-12 MED ORDER — LIDOCAINE VISCOUS HCL 2 % MT SOLN
15.0000 mL | Freq: Once | OROMUCOSAL | Status: AC
  Administered 2018-09-12: 15 mL via ORAL
  Filled 2018-09-12: qty 15

## 2018-09-12 MED ORDER — OMEPRAZOLE 20 MG PO CPDR: 20.0000 mg | DELAYED_RELEASE_CAPSULE | Freq: Two times a day (BID) | ORAL | 0 refills | Status: AC

## 2018-09-12 NOTE — ED Provider Notes (Signed)
[place] EMERGENCY DEPARTMENT Provider Note   CSN: [id number] Arrival date & time: 09/12/18  1950     History   Chief Complaint Chief Complaint  Patient presents with  . Abdominal Pain    HPI Megan Dennis is a 32 y.o. female.  Patient with RUQ abdominal pain that started 2-3 days ago described as waxing and waning. No fever, nausea or vomiting. She reports the most severe pain was tonight after eating. No SoB, cough. She reports mild, infrequent loose stools. She recently had right lower abdominal pain that was diagnosed as an ovarian cyst and reports this has completely resolved. She has tried taking Advil to alleviate symptoms without noticeable relief.  She does not use NSAIDs on a regular basis.  The history is provided by the patient. No language interpreter was used.  Abdominal Pain   Associated symptoms include diarrhea. Pertinent negatives include fever, nausea, vomiting and dysuria.    Past Medical History:  Diagnosis Date  . Asthma    Exercised Induced, and as a child  . Depression   . Migraines     Patient Active Problem List   Diagnosis Date Noted  . Fibroma of foot 12/24/2015  . Metatarsalgia of left foot 11/12/2015  . Loss of transverse plantar arch of left foot 11/12/2015  . Depression 04/29/2015  . Situational anxiety 04/29/2015  . Routine general medical examination at a health care facility 10/15/2014  . TMJ (temporomandibular joint syndrome) 09/16/2014  . Nonallopathic lesion of cervical region 08/22/2014    Past Surgical History:  Procedure Laterality Date  . lipoma removal  1992  . TONSILLECTOMY  2000  . widsom teeth  2002     OB History   None      Home Medications    Prior to Admission medications   Medication Sig Start Date End Date Taking? Authorizing Provider  FLUoxetine (PROZAC) 40 MG capsule TAKE 1 CAPSULE BY MOUTH DAILY. 08/23/18   Lance Sell, NP    Family History Family History  Problem  Relation Age of Onset  . Hyperlipidemia Maternal Grandmother   . Dementia Paternal Grandfather     Social History Social History   Tobacco Use  . Smoking status: Never Smoker  . Smokeless tobacco: Never Used  Substance Use Topics  . Alcohol use: Yes    Alcohol/week: 0.0 standard drinks    Comment: rarely  . Drug use: No     Allergies   Patient has no known allergies.   Review of Systems Review of Systems  Constitutional: Negative for chills and fever.  Respiratory: Negative.  Negative for cough and shortness of breath.   Cardiovascular: Negative.  Negative for chest pain.  Gastrointestinal: Positive for abdominal pain and diarrhea. Negative for nausea and vomiting.  Genitourinary: Negative.  Negative for dysuria, pelvic pain and vaginal discharge.  Musculoskeletal: Negative.  Negative for back pain.  Skin: Negative.   Neurological: Negative.      Physical Exam Updated Vital Signs BP 118/78 (BP Location: Right Arm)   Pulse 95   Temp 98.4 F (36.9 C) (Oral)   Resp 17   LMP 09/04/2018   SpO2 96%   Physical Exam  Constitutional: She appears well-developed and well-nourished.  HENT:  Head: Normocephalic.  Neck: Normal range of motion. Neck supple.  Cardiovascular: Normal rate and regular rhythm.  Pulmonary/Chest: Effort normal and breath sounds normal.  Abdominal: Soft. Bowel sounds are normal. There is tenderness in the right upper quadrant. There is  no rebound and no guarding.  Musculoskeletal: Normal range of motion.  Neurological: She is alert. No cranial nerve deficit.  Skin: Skin is warm and dry. No rash noted.  Psychiatric: She has a normal mood and affect.     ED Treatments / Results  Labs (all labs ordered are listed, but only abnormal results are displayed) Labs Reviewed  COMPREHENSIVE METABOLIC PANEL - Abnormal; Notable for the following components:      Result Value   Glucose, Bld 161 (*)    Calcium 8.5 (*)    All other components within  normal limits  URINALYSIS, ROUTINE W REFLEX MICROSCOPIC - Abnormal; Notable for the following components:   Specific Gravity, Urine 1.034 (*)    All other components within normal limits  LIPASE, BLOOD  CBC  I-STAT BETA HCG BLOOD, ED ([facility], [facility], [facility] ONLY)    EKG None  Radiology No results found.  Procedures Procedures (including critical care time)  Medications Ordered in ED Medications - No data to display   Initial Impression / Assessment and Plan / ED Course  I have reviewed the triage vital signs and the nursing notes.  Pertinent labs & imaging results that were available during my care of the patient were reviewed by me and considered in my medical decision making (see chart for details).     Patient with post-prandial RUQ abdominal pain. No fever, vomiting. REports infrequent loose stools.  Labs are essentially normal. No liver dysfunction, leukocytosis. Symptoms cause concern for biliary colic which is evaluated by RUQ ultrasound, which is negative for gall stones or cholecystitis.   Discussed findings and lab/imaging results with the patient. She has a primary care provider for follow up. Recommend Prilosec BID 7 day trial and recheck with PCP at that time. Patient has further questions that she would like physician input regarding. Dr. Wilson Singer has seen the patient and agrees with plan of care. She is felt appropriate for discharge.   Final Clinical Impressions(s) / ED Diagnoses   Final diagnoses:  None   1. RUQ abdominal pain  ED Discharge Orders    None       Dennie Bible 09/12/18 2358    Virgel Manifold, MD 09/17/18 1556

## 2018-09-12 NOTE — ED Notes (Signed)
ED Provider at bedside. 

## 2018-09-12 NOTE — Discharge Instructions (Addendum)
Follow up with your doctor in one week for recheck of current symptoms. Take Prilosec trial as prescribed.   Return to the ED with any severe pain, high fever, uncontrolled vomiting or new concern.

## 2018-09-12 NOTE — ED Notes (Signed)
Pt went to US  

## 2018-09-12 NOTE — ED Triage Notes (Signed)
Pt states that since last night she has been having RUQ abd pain, denies n/v/d, denies dysuria or discharge.

## 2018-09-13 NOTE — ED Notes (Signed)
PT states understanding of care given, follow up care, and medication prescribed. PT ambulated from ED to car with a steady gait. 

## 2018-09-17 ENCOUNTER — Encounter: Payer: Self-pay | Admitting: Nurse Practitioner

## 2018-09-17 ENCOUNTER — Ambulatory Visit (INDEPENDENT_AMBULATORY_CARE_PROVIDER_SITE_OTHER): Payer: 59 | Admitting: Nurse Practitioner

## 2018-09-17 ENCOUNTER — Ambulatory Visit: Payer: Self-pay | Admitting: *Deleted

## 2018-09-17 VITALS — BP 112/90 | HR 89 | Temp 98.8°F | Ht 67.0 in | Wt 197.0 lb

## 2018-09-17 DIAGNOSIS — R1011 Right upper quadrant pain: Secondary | ICD-10-CM | POA: Diagnosis not present

## 2018-09-17 NOTE — Progress Notes (Signed)
Megan Dennis is a 32 y.o. female with the following history as recorded in Epic[employer]:  Patient Active Problem List   Diagnosis Date Noted  . Fibroma of foot 12/24/2015  . Metatarsalgia of left foot 11/12/2015  . Loss of transverse plantar arch of left foot 11/12/2015  . Depression 04/29/2015  . Situational anxiety 04/29/2015  . Routine general medical examination at a health care facility 10/15/2014  . TMJ (temporomandibular joint syndrome) 09/16/2014  . Nonallopathic lesion of cervical region 08/22/2014    Current Outpatient Medications  Medication Sig Dispense Refill  . FLUoxetine (PROZAC) 40 MG capsule TAKE 1 CAPSULE BY MOUTH DAILY. 30 capsule 1  . omeprazole (PRILOSEC) 20 MG capsule Take 1 capsule (20 mg total) by mouth 2 (two) times daily before a meal for 7 days. (Patient not taking: Reported on 09/17/2018) 14 capsule 0   No current facility-administered medications for this visit.     Allergies: Patient has no known allergies.  Past Medical History:  Diagnosis Date  . Asthma    Exercised Induced, and as a child  . Depression   . Migraines     Past Surgical History:  Procedure Laterality Date  . lipoma removal  1992  . TONSILLECTOMY  2000  . widsom teeth  2002    Family History  Problem Relation Age of Onset  . Hyperlipidemia Maternal Grandmother   . Dementia Paternal Grandfather     Social History   Tobacco Use  . Smoking status: Never Smoker  . Smokeless tobacco: Never Used  Substance Use Topics  . Alcohol use: Yes    Alcohol/week: 0.0 standard drinks    Comment: rarely     Subjective:  Megan Dennis is here today for ED follow up, seen in ED on 09/12/18 for RUQ abdominal pain,  ED workup on 09/12/18 included labs, imaging which showed: Normal kidney and liver function, normal CBC, normal lipase, negative preg test, normal urinalysis. RUQ [place]- 1. Contracted gallbladder without shadowing stone or sonographic Murphy. Slight increased wall thickness which may  be secondary to contraction 2. Small hyperechoic liver masses, possibly representing hemangioma Due to essentially normal labs, no acute abnormalities on imaging, she was discharged home from the ED with instructions to try prilosec BID x 7 days and F/U with PCP Today, she tells me that she has continued to experience the RUQ pain every night since her ED visit, waking her from sleep, describes as "catching or pulling pain". She has not tried prilosec yet, wasn't sure it would help her  Review of Systems  Constitutional: Positive for malaise/fatigue. Negative for chills and fever.  Gastrointestinal: Positive for abdominal pain and nausea. Negative for blood in stool, constipation, diarrhea, heartburn and vomiting.  Genitourinary: Negative for dysuria, frequency and urgency.  Neurological: Negative for loss of consciousness and weakness.  No vaginal discharge or bleeding   Objective:  Vitals:   09/17/18 1542 09/17/18 1707  BP: 112/90   Pulse: (!) 112 89  Temp: 98.8 F (37.1 C)   TempSrc: Oral   SpO2: 96%   Weight: 197 lb (89.4 kg)   Height: 5\' 7"  (1.702 m)     General: Well developed, well nourished, in no acute distress  Skin : Warm and dry.  Head: Normocephalic and atraumatic  Eyes: Sclera and conjunctiva clear; pupils round and reactive to light; extraocular movements intact  Oropharynx: Pink, supple. No suspicious lesions  Neck: Supple Lungs: Respirations unlabored; clear to auscultation bilaterally without wheeze, rales, rhonchi  CVS exam:  normal rate and regular rhythm, S1 and S2 normal.  Abdomen: Soft; nondistended; normoactive bowel sounds; no masses or hepatosplenomegaly; mild tenderness throughout Extremities: No edema, cyanosis Vessels: Symmetric bilaterally  Neurologic: Alert and oriented; speech intact; face symmetrical; moves all extremities well; CNII-XII intact without focal deficit  Psychiatric: Normal mood and affect.  Assessment:  1. Right upper quadrant pain      Plan:   Recommend additional imaging and referral to GI for further evaluation-she is agreeable Home management, red flags and return precautions including when to seek immediate care discussed and printed on AVS Again recommended prilosec trial, not sure if she will start this  No follow-ups on file.  Orders Placed This Encounter  Procedures  . NM Hepato W/Eject Fract    Standing Status:   Future    Standing Expiration Date:   11/19/2019    Order Specific Question:   If indicated for the ordered procedure, I authorize the administration of a radiopharmaceutical per Radiology protocol    Answer:   Yes    Order Specific Question:   Is the patient pregnant?    Answer:   No    Order Specific Question:   Preferred imaging location?    Answer:   [facility]    Order Specific Question:   Radiology Contrast Protocol - do NOT remove file path    Answer:   [email or web address]  . Ambulatory referral to Gastroenterology    Referral Priority:   Routine    Referral Type:   Consultation    Referral Reason:   Specialty Services Required    Number of Visits Requested:   1    Requested Prescriptions    No prescriptions requested or ordered in this encounter

## 2018-09-17 NOTE — Telephone Encounter (Signed)
Pt seen in ED 09/12/18 for upper right quadrant abdominal pain, states "Hasn't gone away." Onset 09/11/18. Per pt "All tests in ED were negative."  US abdomen: IMPRESSION: 1. Contracted gallbladder without shadowing stone or sonographic Murphy. Slight increased wall thickness which may be secondary to contraction 2. Small hyperechoic liver masses, possibly representing hemangioma  States dull constant pain 3/10 with occasional "Shooting pain." States at times radiates to shoulder and back. States worse with "Certain types of movement." Reports occasional nausea, no vomiting. Denies fever, BMs normal. States has been taking Ibuprofen which "Helps with the shooting pain within the hour."  Also seen at [facility] 08/29/18 for hemorrhagic cyst right ovary.  Pt states at that time pain was left sided.  States "This feels different " Like spasms."   Spoke with practice, approved Same Day appt vs. ED follow up. Appt made for today with A. Shambley. Care advise given per protocol.  Reason for Disposition . [1] MODERATE pain (e.g., interferes with normal activities) AND [2] pain comes and goes (cramps) AND [3] present > 24 hours  (Exception: pain with Vomiting or Diarrhea - see that Guideline)  Answer Assessment - Initial Assessment Questions 1. LOCATION: "Where does it hurt?"      Upper right side, "Right below rib cage." 2. RADIATION: "Does the pain shoot anywhere else?" (e.g., chest, back)     "Sometimes to shoulder , and back." 3. ONSET: "When did the pain begin?" (e.g., minutes, hours or days ago)      09/11/18 4. SUDDEN: "Gradual or sudden onset?"     Gradual 5. PATTERN "Does the pain come and go, or is it constant?"    - If constant: "Is it getting better, staying the same, or worsening?"      (Note: Constant means the pain never goes away completely; most serious pain is constant and it progresses)     - If intermittent: "How long does it last?" "Do you have pain now?"     (Note: Intermittent means  the pain goes away completely between bouts)    Dull pain, "Shooting pain at times" 6. SEVERITY: "How bad is the pain?"  (e.g., Scale 1-10; mild, moderate, or severe)   - MILD (1-3): doesn't interfere with normal activities, abdomen soft and not tender to touch    - MODERATE (4-7): interferes with normal activities or awakens from sleep, tender to touch    - SEVERE (8-10): excruciating pain, doubled over, unable to do any normal activities      3/10. Then shooting like a spasm 7. RECURRENT SYMPTOM: "Have you ever had this type of abdominal pain before?" If so, ask: "When was the last time?" and "What happened that time?"      no 8. CAUSE: "What do you think is causing the abdominal pain?"     unsure 9. RELIEVING/AGGRAVATING FACTORS: "What makes it better or worse?" (e.g., movement, antacids, bowel movement)     Worse with deep breath, with certain movements 10. OTHER SYMPTOMS: "Has there been any vomiting, diarrhea, constipation, or urine problems?"      Some nausea, no vomiting 11. PREGNANCY: "Is there any chance you are pregnant?" "When was your last menstrual period?"       no  Protocols used: ABDOMINAL PAIN - [facility]

## 2018-09-17 NOTE — Patient Instructions (Signed)
Start prilosec as prescribed  I have ordered HIDA scan and referral to GI for you today   Abdominal Pain, Adult Many things can cause belly (abdominal) pain. Most times, belly pain is not dangerous. Many cases of belly pain can be watched and treated at home. Sometimes belly pain is serious, though. Your doctor will try to find the cause of your belly pain. Follow these instructions at home:  Take over-the-counter and prescription medicines only as told by your doctor. Do not take medicines that help you poop (laxatives) unless told to by your doctor.  Drink enough fluid to keep your pee (urine) clear or pale yellow.  Watch your belly pain for any changes.  Keep all follow-up visits as told by your doctor. This is important. Contact a doctor if:  Your belly pain changes or gets worse.  You are not hungry, or you lose weight without trying.  You are having trouble pooping (constipated) or have watery poop (diarrhea) for more than 2-3 days.  You have pain when you pee or poop.  Your belly pain wakes you up at night.  Your pain gets worse with meals, after eating, or with certain foods.  You are throwing up and cannot keep anything down.  You have a fever. Get help right away if:  Your pain does not go away as soon as your doctor says it should.  You cannot stop throwing up.  Your pain is only in areas of your belly, such as the right side or the left lower part of the belly.  You have bloody or black poop, or poop that looks like tar.  You have very bad pain, cramping, or bloating in your belly.  You have signs of not having enough fluid or water in your body (dehydration), such as: ? Dark pee, very little pee, or no pee. ? Cracked lips. ? Dry mouth. ? Sunken eyes. ? Sleepiness. ? Weakness. This information is not intended to replace advice given to you by your health care provider. Make sure you discuss any questions you have with your health care  provider. Document Released: 03/21/2008 Document Revised: 04/22/2016 Document Reviewed: 03/16/2016 Elsevier Interactive Patient Education  2018 [employer].

## 2018-09-26 ENCOUNTER — Encounter: Payer: Self-pay | Admitting: Nurse Practitioner

## 2018-10-02 ENCOUNTER — Encounter: Payer: Self-pay | Admitting: Nurse Practitioner

## 2018-10-03 ENCOUNTER — Encounter (HOSPITAL_COMMUNITY): Payer: 59

## 2018-10-15 DIAGNOSIS — R1011 Right upper quadrant pain: Secondary | ICD-10-CM | POA: Diagnosis not present

## 2018-11-12 ENCOUNTER — Encounter: Payer: Self-pay | Admitting: Nurse Practitioner

## 2018-12-17 NOTE — Progress Notes (Signed)
[facility] [address] [place], [place] [address] Phone: [phone] Subjective:   Megan Dennis, am serving as a scribe for Dr. Hulan Saas.   CC: Right arm pain  [id number]  Megan Dennis is a 33 y.o. female coming in with complaint of right arm pain for 2 months. Pain over deltoid tuberosity. Insidious onset. Pain with horizontal adduction and when lying on the arm at night. Denies any numbness or tingling. Pain is achy.  Patient states it can wake her up at night.     Past Medical History:  Diagnosis Date  . Asthma    Exercised Induced, and as a child  . Depression   . Migraines    Past Surgical History:  Procedure Laterality Date  . lipoma removal  1992  . TONSILLECTOMY  2000  . widsom teeth  2002   Social History   Socioeconomic History  . Marital status: Single    Spouse name: Not on file  . Number of children: 0  . Years of education: 63  . Highest education level: Not on file  Occupational History  . Occupation: Art therapist: [employer]  Social Needs  . Financial resource strain: Not on file  . Food insecurity:    Worry: Not on file    Inability: Not on file  . Transportation needs:    Medical: Not on file    Non-medical: Not on file  Tobacco Use  . Smoking status: Never Smoker  . Smokeless tobacco: Never Used  Substance and Sexual Activity  . Alcohol use: Yes    Alcohol/week: 0.0 standard drinks    Comment: rarely  . Drug use: Dennis  . Sexual activity: Yes    Birth control/protection: None  Lifestyle  . Physical activity:    Days per week: Not on file    Minutes per session: Not on file  . Stress: Not on file  Relationships  . Social connections:    Talks on phone: Not on file    Gets together: Not on file    Attends religious service: Not on file    Active member of club or organization: Not on file    Attends meetings of clubs or organizations: Not on file    Relationship status: Not on file    Other Topics Concern  . Not on file  Social History Narrative   Born and raised in [place]. 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 Fun: Sleep when not working.    Denies religious beliefs effecting health care.    Dennis Known Allergies Family History  Problem Relation Age of Onset  . Hyperlipidemia Maternal Grandmother   . Dementia Paternal Grandfather          Current Outpatient Medications (Other):  Marland Kitchen  FLUoxetine (PROZAC) 40 MG capsule, TAKE 1 CAPSULE BY MOUTH DAILY. Marland Kitchen  omeprazole (PRILOSEC) 20 MG capsule, Take 1 capsule (20 mg total) by mouth 2 (two) times daily before a meal for 7 days. (Patient not taking: Reported on 09/17/2018) .  Vitamin D, Ergocalciferol, (DRISDOL) 1.25 MG (50000 UT) CAPS capsule, Take 1 capsule (50,000 Units total) by mouth every 7 (seven) days.    Past medical history, social, surgical and family history all reviewed in electronic medical record.  Dennis pertanent information unless stated regarding to the chief complaint.   Review of Systems:  Dennis headache, visual changes, nausea, vomiting, diarrhea, constipation, dizziness, abdominal pain, skin rash, fevers,  chills, night sweats, weight loss, swollen lymph nodes, body aches, joint swelling, muscle aches, chest pain, shortness of breath, mood changes.   Objective  Blood pressure 118/88, pulse 81, height 5\' 7"  (1.702 m), weight 191 lb (86.6 kg), SpO2 98 %.   General: Dennis apparent distress alert and oriented x3 mood and affect normal, dressed appropriately.  HEENT: Pupils equal, extraocular movements intact  Respiratory: Patient's speak in full sentences and does not appear short of breath  Cardiovascular: Dennis lower extremity edema, non tender, Dennis erythema  Skin: Warm dry intact with Dennis signs of infection or rash on extremities or on axial skeleton.  Abdomen: Soft nontender  Neuro: Cranial nerves II through XII are intact, neurovascularly intact in all extremities with 2+ DTRs and 2+ pulses.   Lymph: Dennis lymphadenopathy of posterior or anterior cervical chain or axillae bilaterally.  Gait normal with good balance and coordination.  MSK:  Non tender with full range of motion and good stability and symmetric strength and tone of  elbows, wrist, hip, knee and ankles bilaterally.  Shoulder: Right Inspection reveals Dennis abnormalities, atrophy or asymmetry. Palpation is normal with Dennis tenderness over AC joint or bicipital groove. ROM is full in all planes. Rotator cuff strength normal throughout. Impingement positive Speeds and Yergason's tests normal. Dennis labral pathology noted with negative Obrien's, negative clunk and good stability. Normal scapular function observed. Dennis painful arc and Dennis drop arm sign. Dennis apprehension sign  MSK US performed of: Right shoulder This study was ordered, performed, and interpreted by Charlann Boxer D.O.  Shoulder:   Supraspinatus:  Appears normal on long and transverse views, Dennis bursal bulge seen with shoulder abduction on impingement view.  Active impingement view does show some mild hypoechoic changes AC joint: Mild inflammation Glenohumeral Joint:  Appears normal without effusion. Glenoid Labrum:  Intact without visualized tears. Biceps Tendon:  Appears normal on long and transverse views, Dennis fraying of tendon, tendon located in intertubercular groove, Dennis subluxation with shoulder internal or external rotation. Dennis increased power doppler signal. Numerous proximally does have some swelling over the aspect. Impression: Possible stress reaction of the humerus   Impression and Recommendations:     This case required medical decision making of moderate complexity. The above documentation has been reviewed and is accurate and complete Megan Pulley, DO       Note: This dictation was prepared with Dragon dictation along with smaller phrase technology. Any transcriptional errors that result from this process are unintentional.

## 2018-12-18 ENCOUNTER — Ambulatory Visit (INDEPENDENT_AMBULATORY_CARE_PROVIDER_SITE_OTHER): Payer: 59 | Admitting: Family Medicine

## 2018-12-18 ENCOUNTER — Encounter: Payer: Self-pay | Admitting: Family Medicine

## 2018-12-18 ENCOUNTER — Ambulatory Visit: Payer: Self-pay

## 2018-12-18 VITALS — BP 118/88 | HR 81 | Ht 67.0 in | Wt 191.0 lb

## 2018-12-18 DIAGNOSIS — G8929 Other chronic pain: Secondary | ICD-10-CM | POA: Diagnosis not present

## 2018-12-18 DIAGNOSIS — M25511 Pain in right shoulder: Secondary | ICD-10-CM | POA: Insufficient documentation

## 2018-12-18 MED ORDER — VITAMIN D (ERGOCALCIFEROL) 1.25 MG (50000 UNIT) PO CAPS: 50000.0000 [IU] | ORAL_CAPSULE | ORAL | 0 refills | Status: DC

## 2018-12-18 MED FILL — VIT D2 1.25 MG (50,000 UNIT: 1.25 MG | 84 days supply | Qty: 12 | Fill #0

## 2018-12-18 NOTE — Assessment & Plan Note (Signed)
Chronic at this point.  Possibly overuse injury.  Discussed the possibility of injection.  Discussed which activities to do which was to avoid discussed the possibility of stress reaction and vitamin D.  Follow-up again in 4 to 6 weeks

## 2018-12-18 NOTE — Patient Instructions (Addendum)
Good to see you  Ice 20 minutes 2 times daily. Usually after activity and before bed. pennsaid pinkie amount topically 2 times daily as needed.  Keep hands witihin peripheral vison  Drop weight with uper body lifting.  Once weekly vitamin D for 12 weeks K2 over the counter daily for 4 weeks See em again in 4 weeks for sure  Call [phone] if you need me sooner

## 2019-01-04 ENCOUNTER — Telehealth: Payer: 59 | Admitting: Family

## 2019-01-04 DIAGNOSIS — R6889 Other general symptoms and signs: Secondary | ICD-10-CM | POA: Diagnosis not present

## 2019-01-04 DIAGNOSIS — J029 Acute pharyngitis, unspecified: Secondary | ICD-10-CM

## 2019-01-04 DIAGNOSIS — R059 Cough, unspecified: Secondary | ICD-10-CM

## 2019-01-04 DIAGNOSIS — R05 Cough: Secondary | ICD-10-CM

## 2019-01-04 MED ORDER — BENZONATATE 100 MG PO CAPS: 100.0000 mg | ORAL_CAPSULE | Freq: Three times a day (TID) | ORAL | 0 refills | Status: AC | PRN

## 2019-01-04 NOTE — Progress Notes (Signed)
Greater than 5 minutes, yet less than 10 minutes of time have been spent researching, coordinating, and implementing care for this patient today.  Thank you for the details you included in the comment boxes. Those details are very helpful in determining the best course of treatment for you and help [place] to provide the best care.  E-Visit for Corona Virus Screening Based on your current symptoms, it seems unlikely that your symptoms are related to the [place] virus.   Coronavirus disease 2019 (COVID-19) is a respiratory illness that can spread from person to person. The virus that causes COVID-19 is a new virus that was first identified in the country of [place] but is now found in multiple other countries and has spread to the [place].  Symptoms associated with the virus are mild to severe fever, cough, and shortness of breath. There is currently no vaccine to protect against COVID-19, and there is no specific antiviral treatment for the virus.  It is vitally important that if you feel that you have an infection such as this virus or any other virus that you stay home and away from places where you may spread it to others.  Currently, not all patients are being tested. If the symptoms are mild and there is not a known exposure, performing the test is not indicated.  You can use medication such as A prescription cough medication called Tessalon Perles 100 mg. You may take 1-2 capsules every 8 hours as needed for cough and A prescription inhaler called Albuterol MDI 90 mcg /actuation 2 puffs every 4 hours as needed for shortness of breath, wheezing, cough  Reduce your risk of any infection by using the same precautions used for avoiding the common cold or flu:  Marland Kitchen Wash your hands often with soap and warm water for at least 20 seconds.  If soap and water are not readily available, use an alcohol-based hand sanitizer with at least 60% alcohol.  . If coughing or sneezing, cover your mouth and nose by  coughing or sneezing into the elbow areas of your shirt or coat, into a tissue or into your sleeve (not your hands). . Avoid shaking hands with others and consider head nods or verbal greetings only. . Avoid touching your eyes, nose, or mouth with unwashed hands.  . Avoid close contact with people who are sick. . Avoid places or events with large numbers of people in one location, like concerts or sporting events. . Carefully consider travel plans you have or are making. . If you are planning any travel outside or inside the [place], visit the CDC's Travelers' Health webpage for the latest health notices. . If you have some symptoms but not all symptoms, continue to monitor at home and seek medical attention if your symptoms worsen. . If you are having a medical emergency, call 911.  HOME CARE . Only take medications as instructed by your medical team. . Drink plenty of fluids and get plenty of rest. . A steam or ultrasonic humidifier can help if you have congestion.   GET HELP RIGHT AWAY IF: . You develop worsening fever. . You become short of breath . You cough up blood. . Your symptoms become more severe MAKE SURE YOU   Understand these instructions.  Will watch your condition.  Will get help right away if you are not doing well or get worse.  Your e-visit answers were reviewed by a board certified advanced clinical practitioner to complete your personal care plan.  Depending on the condition, your plan could have included both over the counter or prescription medications.  If there is a problem please reply once you have received a response from your provider. Your safety is important to [place].  If you have drug allergies check your prescription carefully.    You can use MyChart to ask questions about today's visit, request a non-urgent call back, or ask for a work or school excuse for 24 hours related to this e-Visit. If it has been greater than 24 hours you will need to follow up with  your provider, or enter a new e-Visit to address those concerns. You will get an e-mail in the next two days asking about your experience.  I hope that your e-visit has been valuable and will speed your recovery. Thank you for using e-visits.

## 2019-01-04 NOTE — Progress Notes (Signed)
We are sorry that you are not feeling well.  Here is how we plan to help!  Your symptoms indicate a likely viral infection (Pharyngitis).   Pharyngitis is inflammation in the back of the throat which can cause a sore throat, scratchiness and sometimes difficulty swallowing.   Pharyngitis is typically caused by a respiratory virus and will just run its course.  Please keep in mind that your symptoms could last up to 10 days.  For throat pain, we recommend over the counter oral pain relief medications such as acetaminophen or aspirin, or anti-inflammatory medications such as ibuprofen or naproxen sodium.  Topical treatments such as oral throat lozenges or sprays may be used as needed.  Avoid close contact with loved ones, especially the very young and elderly.  Remember to wash your hands thoroughly throughout the day as this is the number one way to prevent the spread of infection and wipe down door knobs and counters with disinfectant.   *It looks like you did an Evisit earlier for cough and told to quarantine for the next 14 days. I do recommend to continue with these recommendations.   Approximately 5 minutes was spent documenting and reviewing patient's chart.    After careful review of your answers, I would not recommend and antibiotic for your condition.  Antibiotics should not be used to treat conditions that we suspect are caused by viruses like the virus that causes the common cold or flu. However, some people can have Strep with atypical symptoms. You may need formal testing in clinic or office to confirm if your symptoms continue or worsen.  Providers prescribe antibiotics to treat infections caused by bacteria. Antibiotics are very powerful in treating bacterial infections when they are used properly.  To maintain their effectiveness, they should be used only when necessary.  Overuse of antibiotics has resulted in the development of super bugs that are resistant to treatment!    Home  Care:  Only take medications as instructed by your medical team.  Do not drink alcohol while taking these medications.  A steam or ultrasonic humidifier can help congestion.  You can place a towel over your head and breathe in the steam from hot water coming from a faucet.  Avoid close contacts especially the very young and the elderly.  Cover your mouth when you cough or sneeze.  Always remember to wash your hands.  Get Help Right Away If:  You develop worsening fever or throat pain.  You develop a severe head ache or visual changes.  Your symptoms persist after you have completed your treatment plan.  Make sure you  Understand these instructions.  Will watch your condition.  Will get help right away if you are not doing well or get worse.  Your e-visit answers were reviewed by a board certified advanced clinical practitioner to complete your personal care plan.  Depending on the condition, your plan could have included both over the counter or prescription medications.  If there is a problem please reply  once you have received a response from your provider.  Your safety is important to [place].  If you have drug allergies check your prescription carefully.    You can use MyChart to ask questions about todays visit, request a non-urgent call back, or ask for a work or school excuse for 24 hours related to this e-Visit. If it has been greater than 24 hours you will need to follow up with your provider, or enter a new e-Visit to  address those concerns.  You will get an e-mail in the next two days asking about your experience.  I hope that your e-visit has been valuable and will speed your recovery. Thank you for using e-visits.

## 2019-01-15 ENCOUNTER — Other Ambulatory Visit: Payer: Self-pay

## 2019-01-15 ENCOUNTER — Ambulatory Visit (INDEPENDENT_AMBULATORY_CARE_PROVIDER_SITE_OTHER): Payer: 59 | Admitting: Family Medicine

## 2019-01-15 ENCOUNTER — Encounter: Payer: Self-pay | Admitting: Family Medicine

## 2019-01-15 ENCOUNTER — Ambulatory Visit: Payer: Self-pay

## 2019-01-15 VITALS — BP 106/72 | HR 91 | Ht 67.0 in

## 2019-01-15 DIAGNOSIS — G8929 Other chronic pain: Secondary | ICD-10-CM

## 2019-01-15 DIAGNOSIS — M25511 Pain in right shoulder: Secondary | ICD-10-CM | POA: Diagnosis not present

## 2019-01-15 NOTE — Assessment & Plan Note (Signed)
Seem to be more like bursitis.  Discussed icing regimen and home exercise.  Discussed which activities to do which wants to avoid.  Hopefully patient will respond to this.  Continue to once weekly vitamin D.  Discussed posture and ergonomics.  Follow-up again in 4 to 8 weeks

## 2019-01-15 NOTE — Progress Notes (Signed)
[facility]egan [facility], [place] [address] Phone: [phone] Subjective:   I Megan Dennis am serving as a Education administrator for Dr. Hulan Saas.   CC: Right shoulder pain follow-up  IOE:[id number]  Megan Dennis coming in with complaint of right shoulder pain. Still in some pain.  Patient describes a dull, throbbing aching pain.  Has not been doing the exercises as regularly.  Thinks it has been a little bit more improved recently.       Past Medical History:  Diagnosis Date  . Asthma    Exercised Induced, and as a child  . Depression   . Migraines    Past Surgical History:  Procedure Laterality Date  . lipoma removal  1992  . TONSILLECTOMY  2000  . widsom teeth  2002   Social History   Socioeconomic History  . Marital status: Single    Spouse name: Not on file  . Number of children: 0  . Years of education: 51  . Highest education level: Not on file  Occupational History  . Occupation: Art therapist: [employer]  Social Needs  . Financial resource strain: Not on file  . Food insecurity:    Worry: Not on file    Inability: Not on file  . Transportation needs:    Medical: Not on file    Non-medical: Not on file  Tobacco Use  . Smoking status: Never Smoker  . Smokeless tobacco: Never Used  Substance and Sexual Activity  . Alcohol use: Yes    Alcohol/week: 0.0 standard drinks    Comment: rarely  . Drug use: No  . Sexual activity: Yes    Birth control/protection: None  Lifestyle  . Physical activity:    Days per week: Not on file    Minutes per session: Not on file  . Stress: Not on file  Relationships  . Social connections:    Talks on phone: Not on file    Gets together: Not on file    Attends religious service: Not on file    Active member of club or organization: Not on file    Attends meetings of clubs or organizations: Not on file    Relationship status: Not on file  Other Topics Concern   . Not on file  Social History Narrative   Born and raised in [place]. 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 Fun: Sleep when not working.    Denies religious beliefs effecting health care.    No Known Allergies Family History  Problem Relation Age of Onset  . Hyperlipidemia Maternal Grandmother   . Dementia Paternal Grandfather       Current Outpatient Medications (Respiratory):  .  benzonatate (TESSALON PERLES) 100 MG capsule, Take 1-2 capsules (100-200 mg total) by mouth every 8 (eight) hours as needed for cough.    Current Outpatient Medications (Other):  Marland Kitchen  FLUoxetine (PROZAC) 40 MG capsule, TAKE 1 CAPSULE BY MOUTH DAILY. Marland Kitchen  Vitamin D, Ergocalciferol, (DRISDOL) 1.25 MG (50000 UT) CAPS capsule, Take 1 capsule (50,000 Units total) by mouth every 7 (seven) days. Marland Kitchen  omeprazole (PRILOSEC) 20 MG capsule, Take 1 capsule (20 mg total) by mouth 2 (two) times daily before a meal for 7 days. (Patient not taking: Reported on 09/17/2018)    Past medical history, social, surgical and family history all reviewed in electronic medical record.  No pertanent information unless stated regarding  to the chief complaint.   Review of Systems:  No headache, visual changes, nausea, vomiting, diarrhea, constipation, dizziness, abdominal pain, skin rash, fevers, chills, night sweats, weight loss, swollen lymph nodes, body aches, joint swelling, chest pain, shortness of breath, mood changes.  Positive muscle aches  Objective  Blood pressure 106/72, pulse 91, height 5\' 7"  (1.702 m), SpO2 98 %.    General: No apparent distress alert and oriented x3 mood and affect normal, dressed appropriately.  HEENT: Pupils equal, extraocular movements intact  Respiratory: Patient's speak in full sentences and does not appear short of breath  Cardiovascular: No lower extremity edema, non tender, no erythema  Skin: Warm dry intact with no signs of infection or rash on extremities or on axial skeleton.   Abdomen: Soft nontender  Neuro: Cranial nerves II through XII are intact, neurovascularly intact in all extremities with 2+ DTRs and 2+ pulses.  Lymph: No lymphadenopathy of posterior or anterior cervical chain or axillae bilaterally.  Gait normal with good balance and coordination.  MSK:  Non tender with full range of motion and good stability and symmetric strength and tone of  elbows, wrist, hip, knee and ankles bilaterally.  Patient exam still shows positive impingement.  Patient does have full range of motion.  Less pain over the humeral area at this time.  Procedure: Real-time Ultrasound Guided Injection of right glenohumeral joint Device: GE Logiq Q7  Ultrasound guided injection is preferred based studies that show increased duration, increased effect, greater accuracy, decreased procedural pain, increased response rate with ultrasound guided versus blind injection.  Verbal informed consent obtained.  Time-out conducted.  Noted no overlying erythema, induration, or other signs of local infection.  Skin prepped in a sterile fashion.  Local anesthesia: Topical Ethyl chloride.  With sterile technique and under real time ultrasound guidance:  Joint visualized.  23g 1  inch needle inserted posterior approach. Pictures taken for needle placement. Patient did have injection of 2 cc of 1% lidocaine, 2 cc of 0.5% Marcaine, and 1.0 cc of Kenalog 40 mg/dL. Completed without difficulty  Pain immediately resolved suggesting accurate placement of the medication.  Advised to call if fevers/chills, erythema, induration, drainage, or persistent bleeding.  Images permanently stored and available for review in the ultrasound unit.  Impression: Technically successful ultrasound guided injection.    Impression and Recommendations:     This case required medical decision making of moderate complexity. The above documentation has been reviewed and is accurate and complete Megan Pulley, DO        Note: This dictation was prepared with Dragon dictation along with smaller phrase technology. Any transcriptional errors that result from this process are unintentional.

## 2019-03-07 DIAGNOSIS — Z03818 Encounter for observation for suspected exposure to other biological agents ruled out: Secondary | ICD-10-CM | POA: Diagnosis not present

## 2019-04-02 DIAGNOSIS — Z01419 Encounter for gynecological examination (general) (routine) without abnormal findings: Secondary | ICD-10-CM | POA: Diagnosis not present

## 2019-04-02 DIAGNOSIS — N72 Inflammatory disease of cervix uteri: Secondary | ICD-10-CM | POA: Diagnosis not present

## 2019-04-02 DIAGNOSIS — B977 Papillomavirus as the cause of diseases classified elsewhere: Secondary | ICD-10-CM | POA: Diagnosis not present

## 2019-04-02 DIAGNOSIS — Z6830 Body mass index (BMI) 30.0-30.9, adult: Secondary | ICD-10-CM | POA: Diagnosis not present

## 2019-04-02 DIAGNOSIS — Z1151 Encounter for screening for human papillomavirus (HPV): Secondary | ICD-10-CM | POA: Diagnosis not present

## 2019-04-24 ENCOUNTER — Other Ambulatory Visit: Payer: Self-pay | Admitting: Family Medicine

## 2019-04-24 MED FILL — FLUoxetine HCL 40 MG CAPS: 40 | 90 days supply | Qty: 90 | Fill #0

## 2019-04-24 MED FILL — VIT D2 1.25 MG (50,000 UNIT: 1.25 MG | 84 days supply | Qty: 12 | Fill #0

## 2019-06-02 IMAGING — US US PELV - US TRANSVAGINAL
1 series · 13 of 25 positions shown · non-contrast
Comparison: None

CLINICAL DATA: Pelvic pain more on LEFT side for 4 days, negative
urine pregnancy test, LMP 08/05/2018

EXAM:
TRANSABDOMINAL AND TRANSVAGINAL ULTRASOUND OF PELVIS
DOPPLER ULTRASOUND OF OVARIES
TECHNIQUE: Both transabdominal and transvaginal ultrasound examinations of the
pelvis were performed. Transabdominal technique was performed for
global imaging of the pelvis including uterus, ovaries, adnexal
regions, and pelvic cul-de-sac.
It was necessary to proceed with endovaginal exam following the
transabdominal exam to visualize the uterus and endometrium, and to
characterize a RIGHT ovarian lesion.. Color and duplex Doppler
ultrasound was utilized to evaluate blood flow to the ovaries.

[Series 1: us pelv - us transvaginal · 0.23mm/px · 13 of 51 slices shown]
[im 1/51]
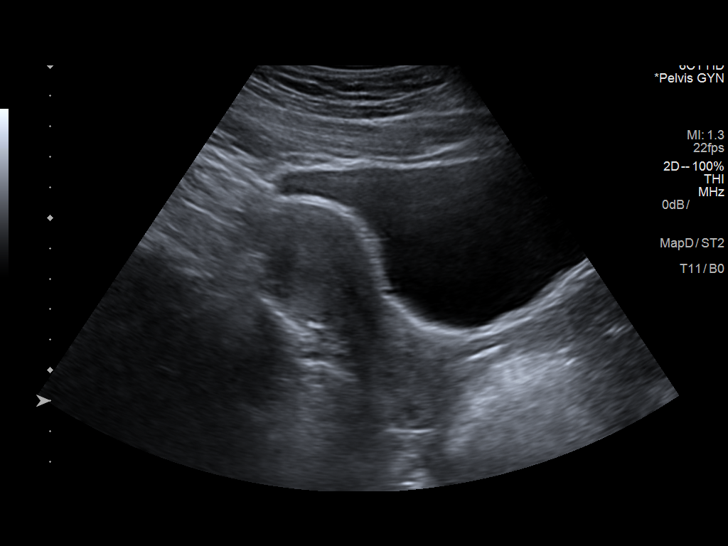
[im 5/51]
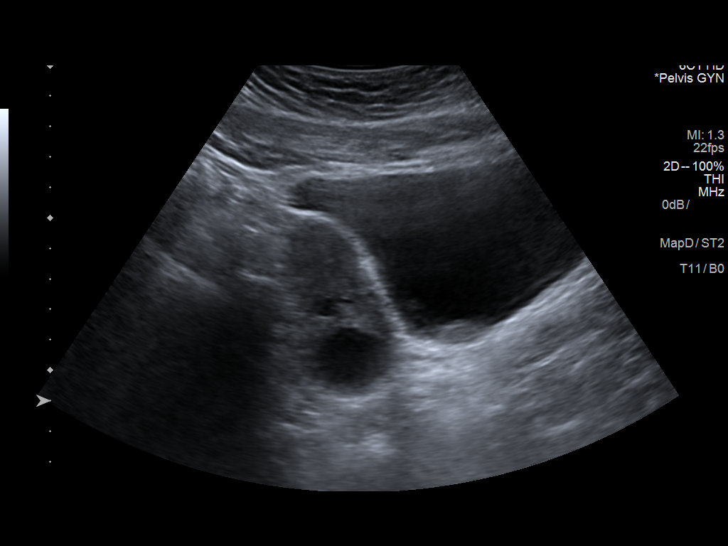
[im 9/51]
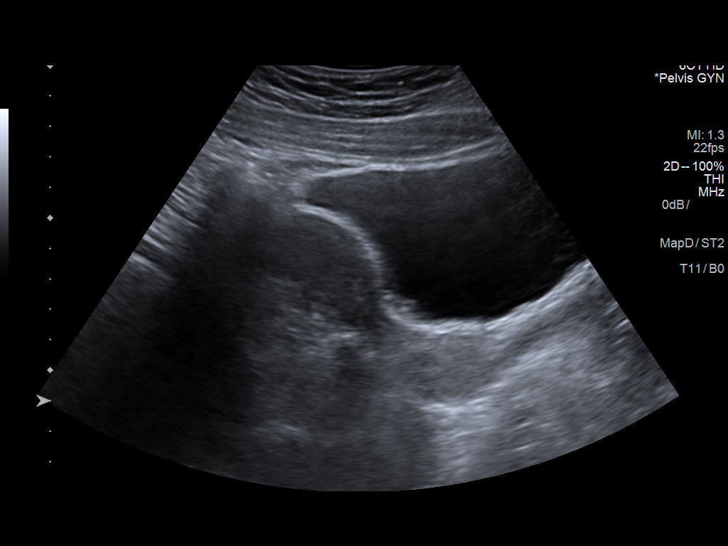
[im 13/51]
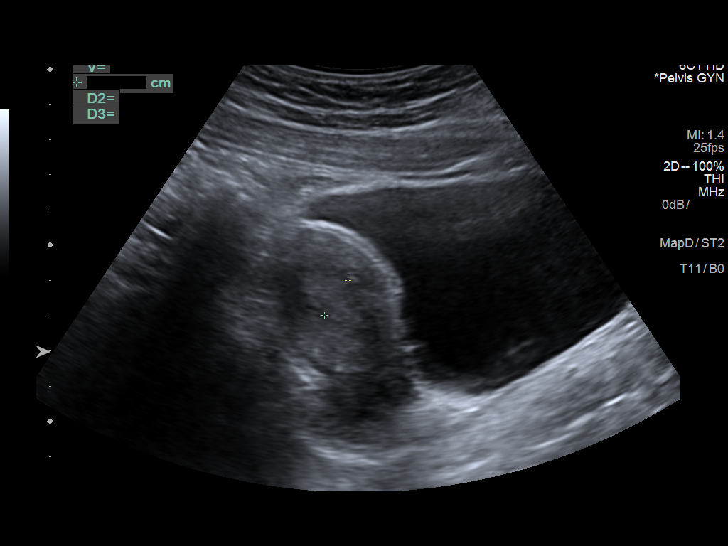
[im 17/51]
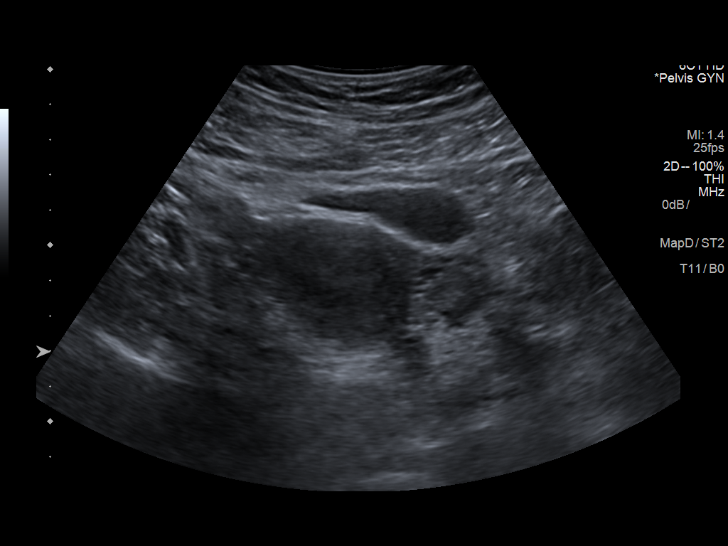
[im 21/51]
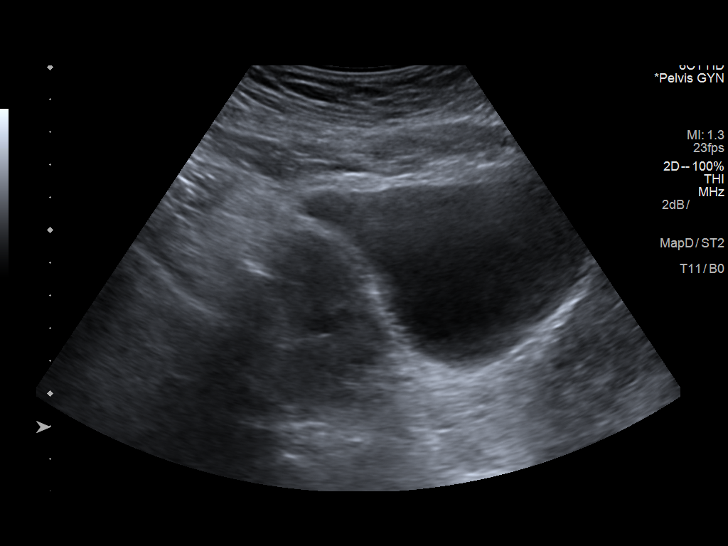
[im 26/51]
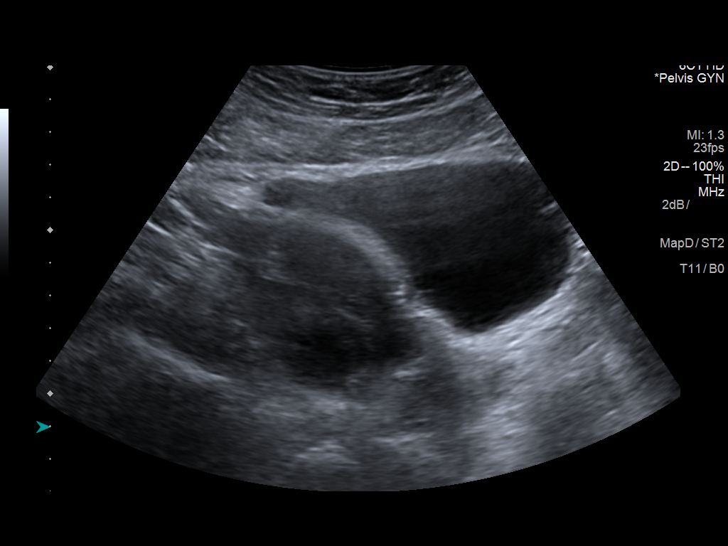
[im 30/51]
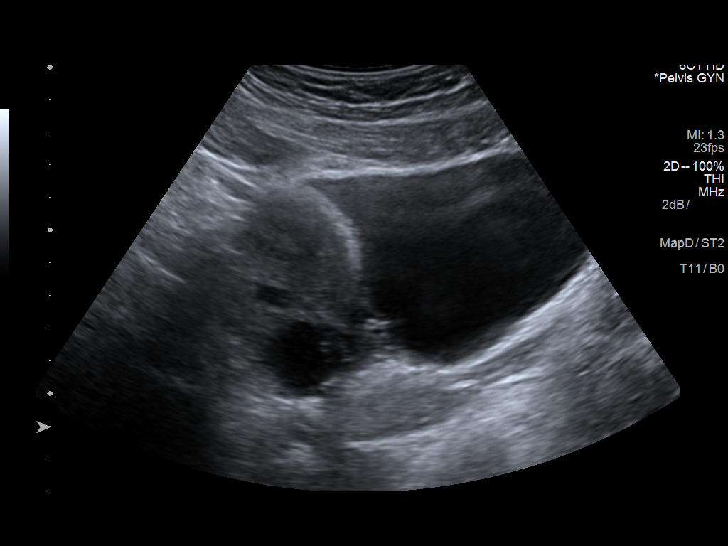
[im 34/51]
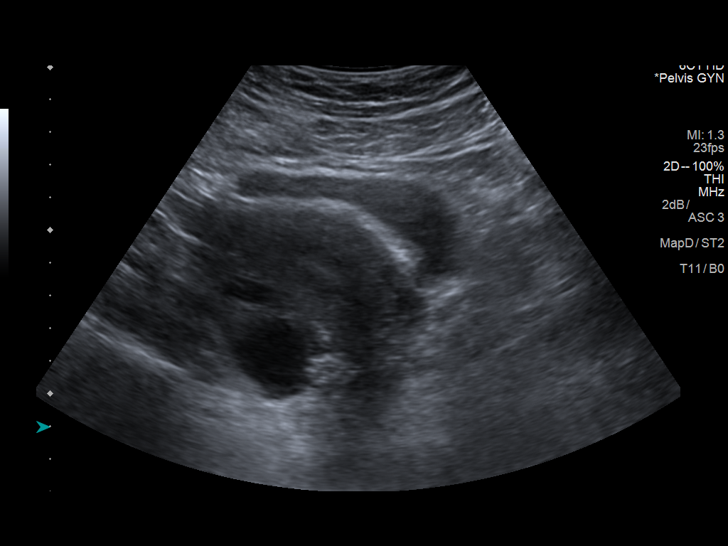
[im 38/51]
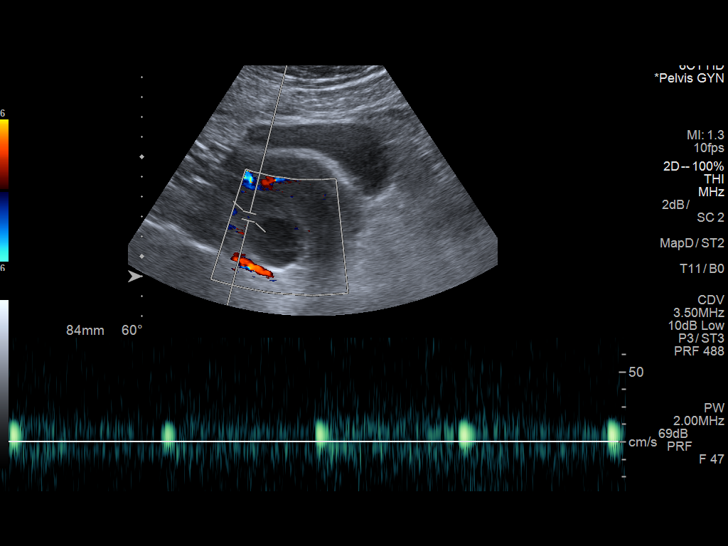
[im 42/51]
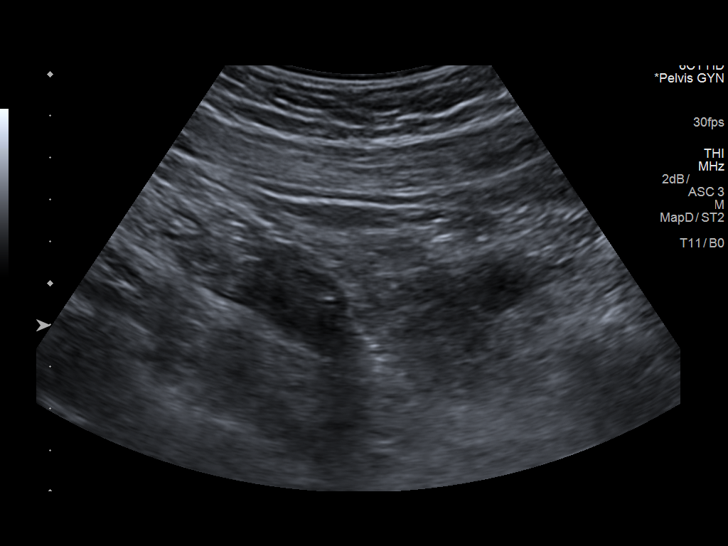
[im 46/51]
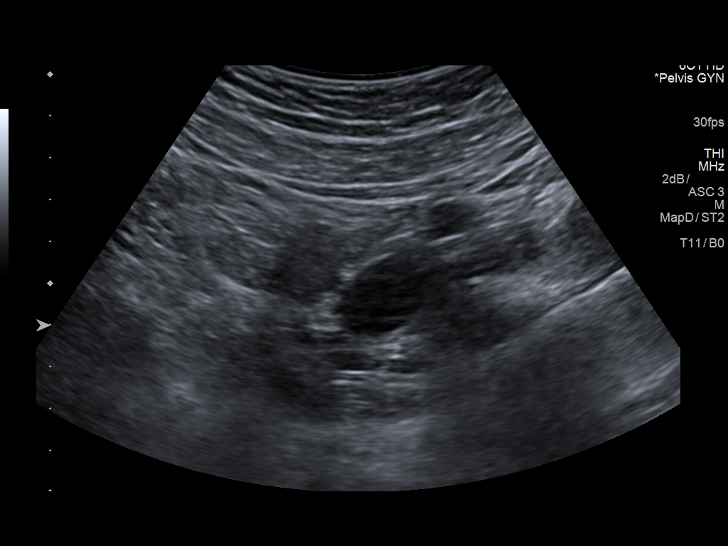
[im 51/51]
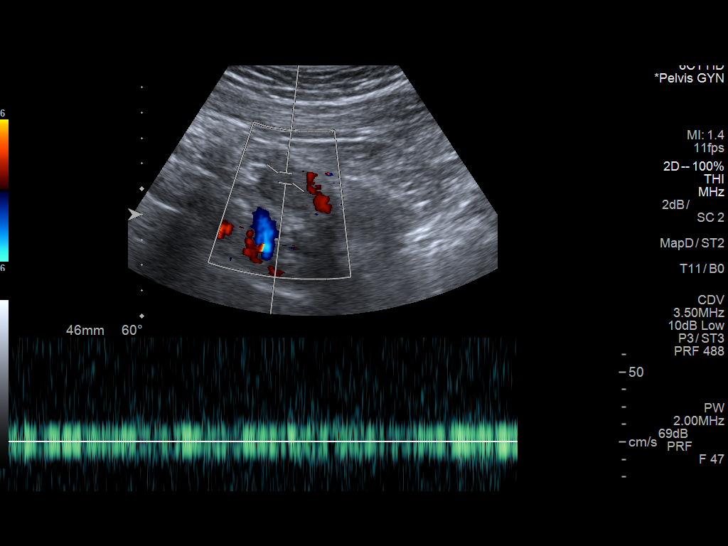

[13 of 25 positions shown; findings below may reference images not displayed]

FINDINGS: Uterus

Measurements: 7.7 x 3.1 x 4.3 cm = volume: 54.4 mL. Normal
morphology without mass

Endometrium

Thickness: 10 mm thick. Mildly heterogeneous echogenicity without
endometrial fluid or focal mass

Right ovary

Measurements: 4.2 x 3.1 x 3.2 cm = volume: 22.4 mL. Complex cyst
within RIGHT ovary 2.8 x 2.6 x 2.7 cm containing low level minimally
heterogeneous internal echogenicity most likely representing a
hemorrhagic cyst. Blood flow present within RIGHT ovary but not
within the complex cystic lesion on color Doppler imaging.

Left ovary

Measurements: 3.0 x 1.5 x 2.5 cm = volume: 5.8 mL. Normal morphology
without mass. Blood flow present within LEFT ovary on color Doppler
imaging.

Pulsed Doppler: Low resistance arterial and venous waveforms are
present within both ovaries.

Other: Trace free pelvic fluid.  No additional adnexal masses.
IMPRESSION: Normal appearing uterus and LEFT ovary.

Suspected hemorrhagic cyst within RIGHT ovary 2.8 cm greatest size,
recommendation below.

Short-interval follow up ultrasound in 6-12 weeks is recommended,
preferably during the week following the patient's normal menses.

## 2019-06-16 IMAGING — US US ABDOMEN LIMITED
1 series · 14 of 25 positions shown · non-contrast
Comparison: None.

CLINICAL DATA: Right upper quadrant pain

EXAM:
ULTRASOUND ABDOMEN LIMITED RIGHT UPPER QUADRANT

[Series 1: us abdomen limited · 0.14mm/px · 14 of 31 slices shown]
[im 1/31]
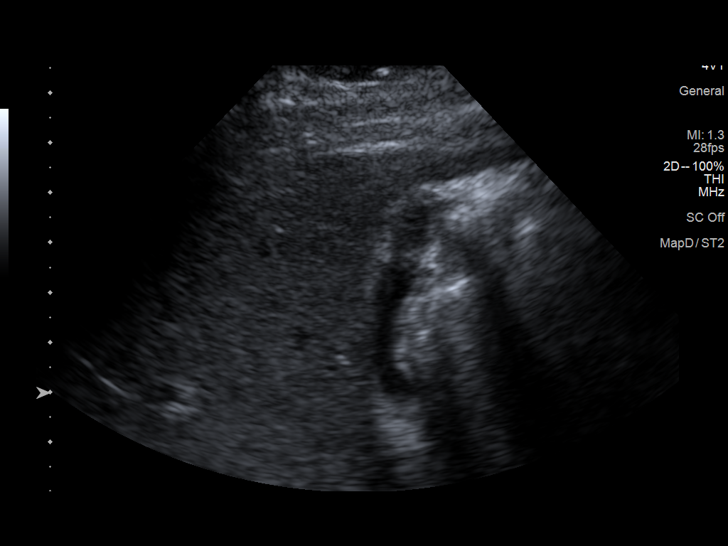
[im 3/31]
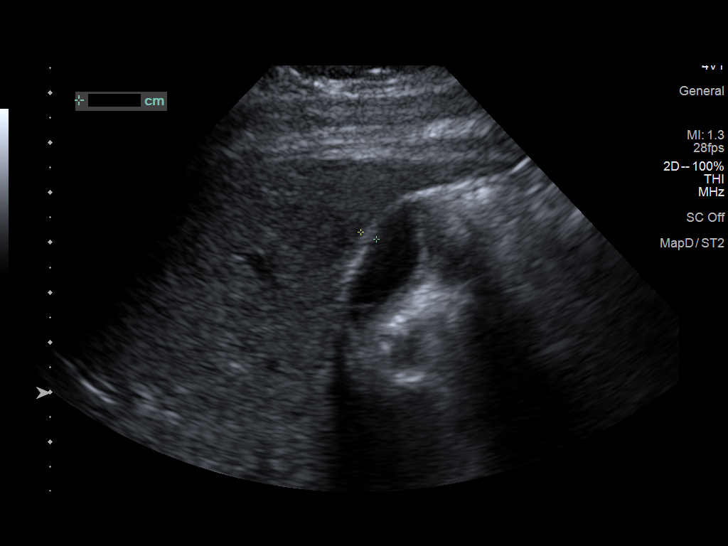
[im 6/31]
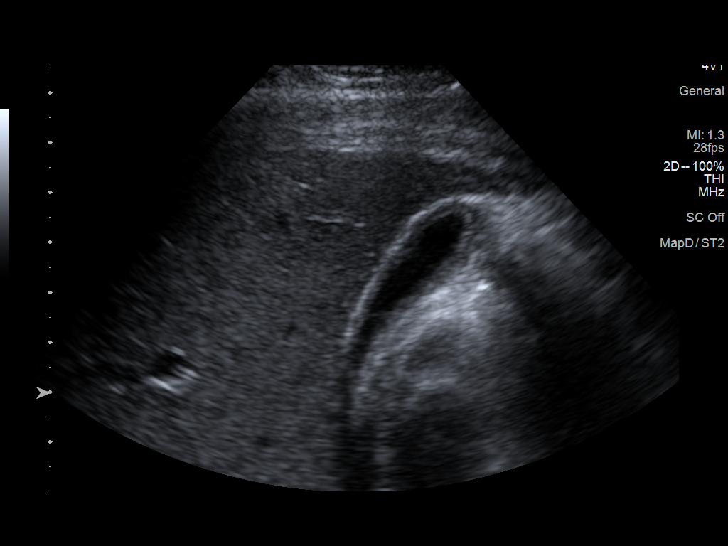
[im 8/31]
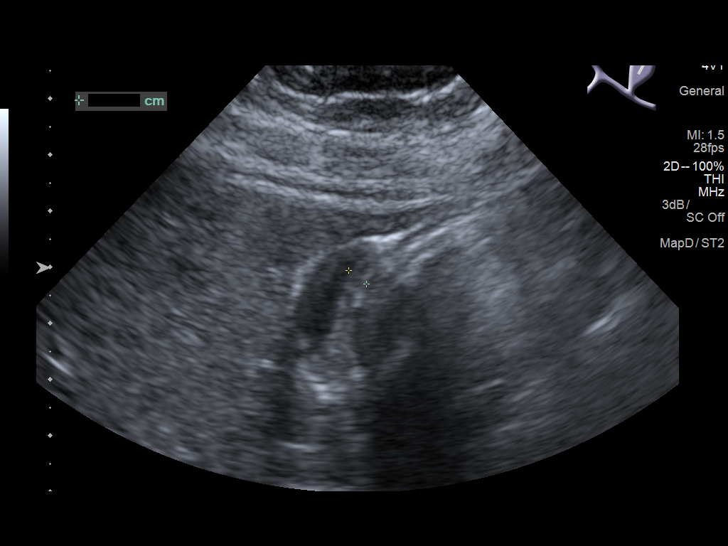
[im 11/31]
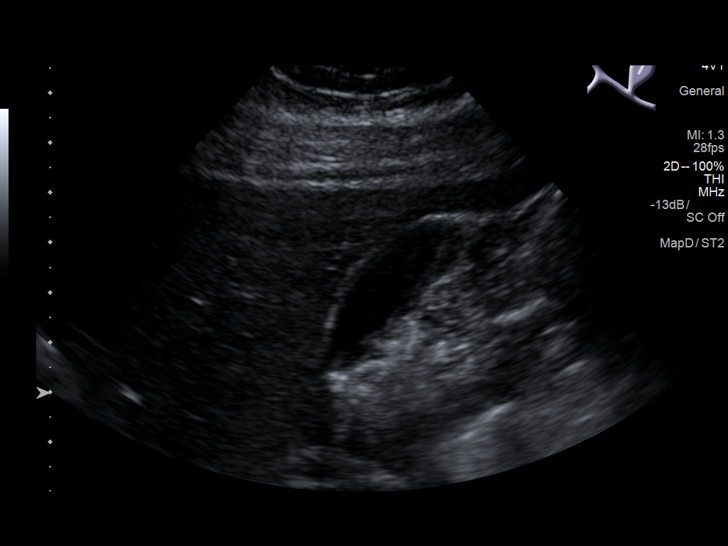
[im 12/31]
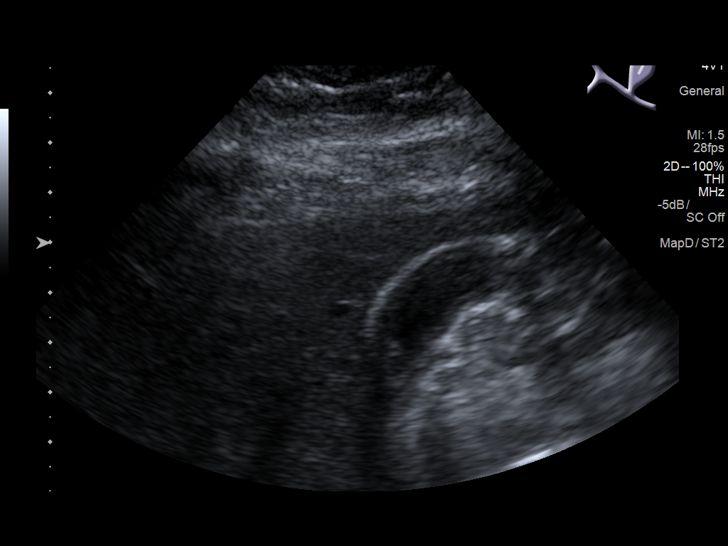
[im 14/31]
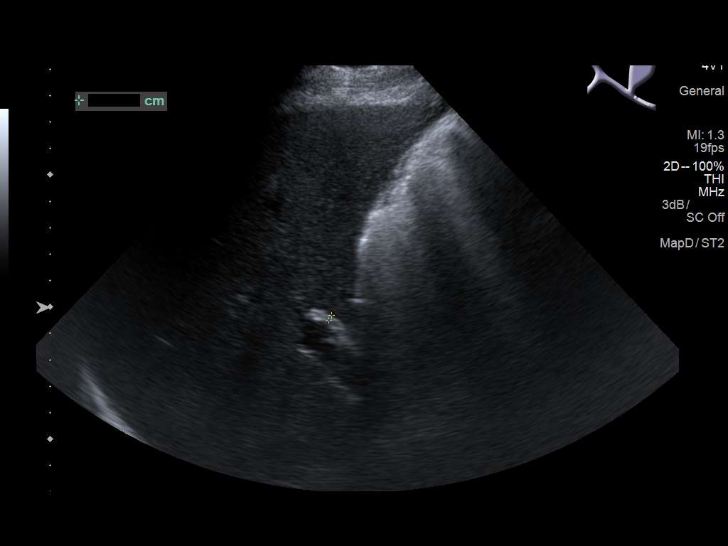
[im 17/31]
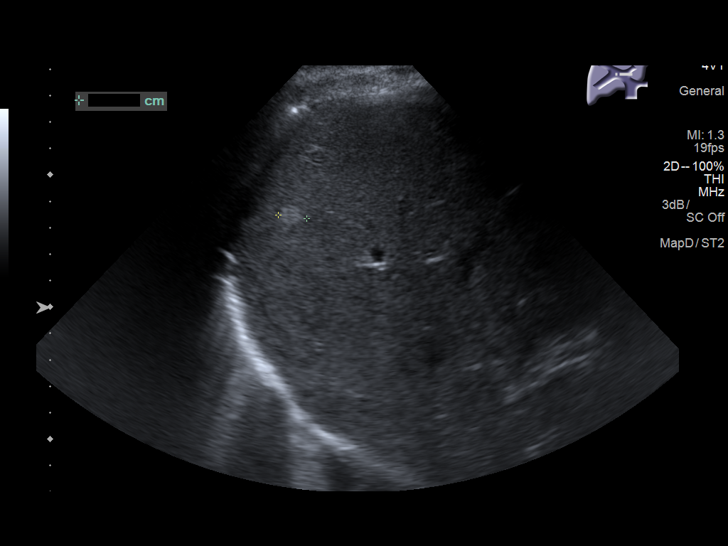
[im 19/31]
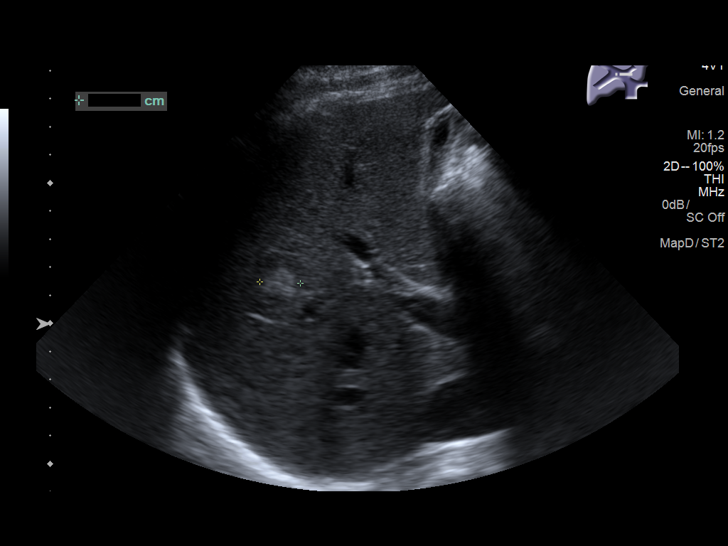
[im 21/31]
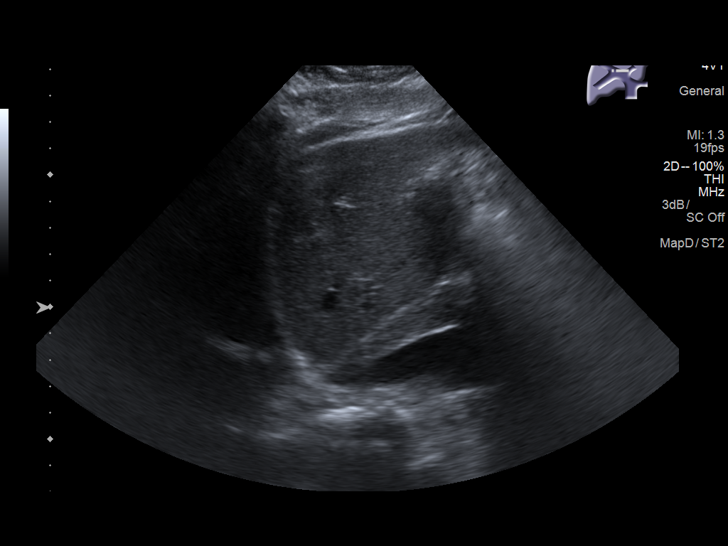
[im 23/31]
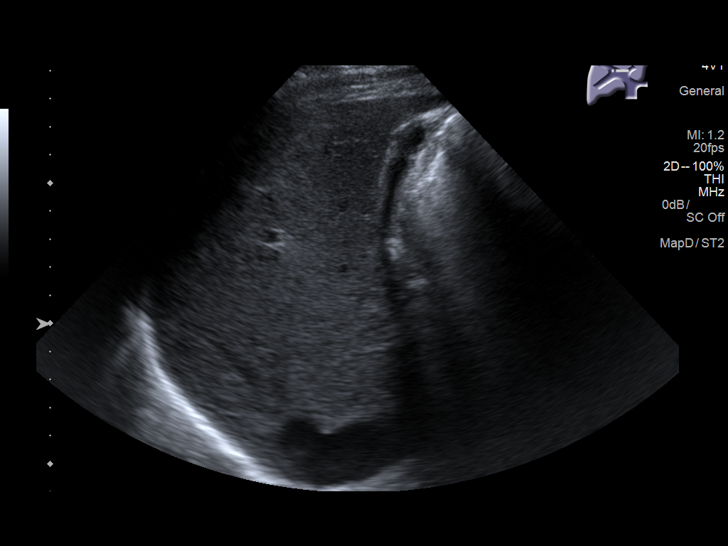
[im 26/31]
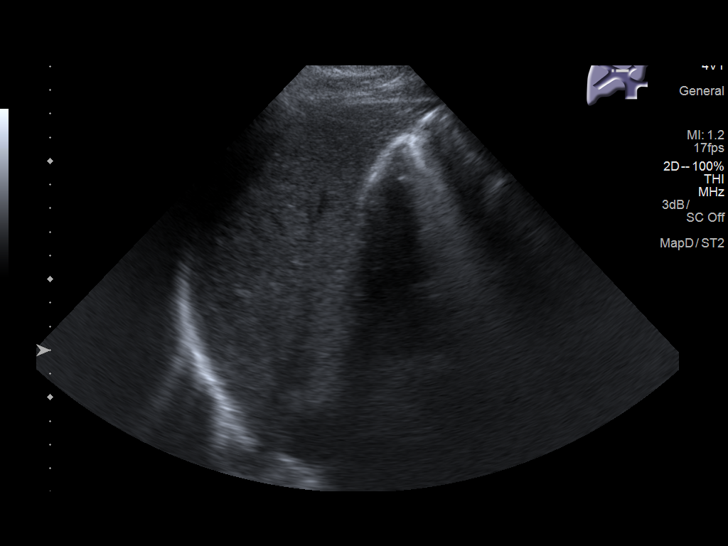
[im 28/31]
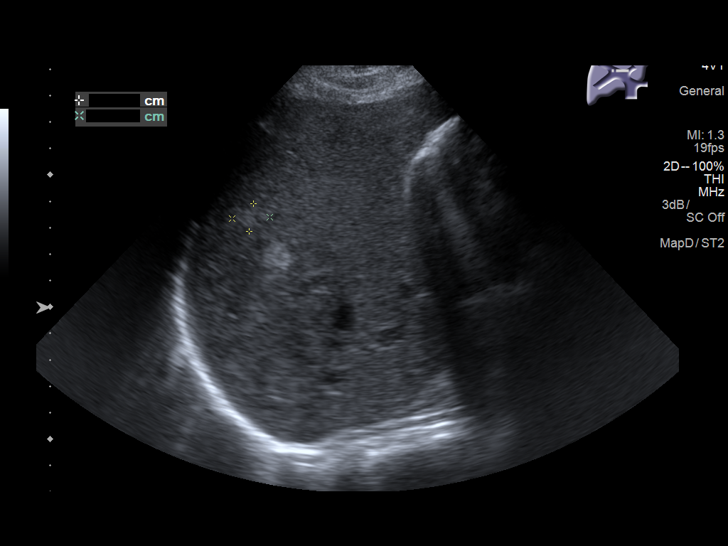
[im 31/31]
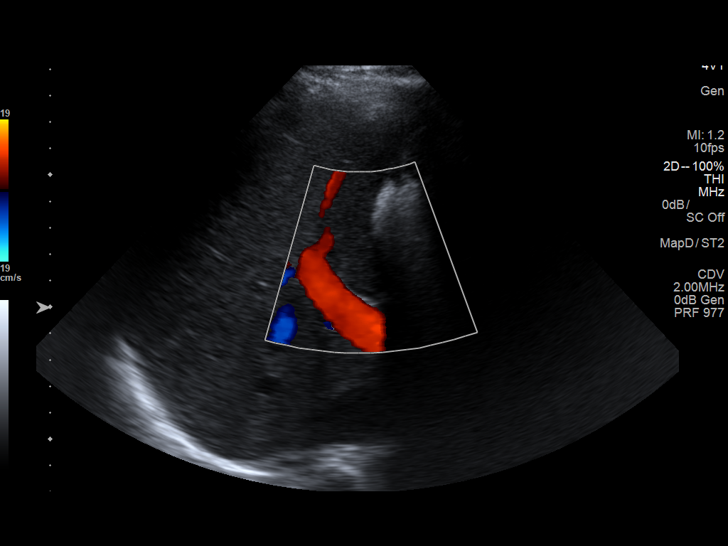

[14 of 25 positions shown; findings below may reference images not displayed]

FINDINGS: Gallbladder:

Contracted appearing gallbladder. No shadowing stones. Negative
sonographic Murphy. Slight increased wall thickness.

Common bile duct:

Diameter: 2.2 mm

Liver:

Echogenicity within normal limits. Small hyperechoic masses in the
right lobe of the liver measuring up to 1.4 cm. Portal vein is
patent on color Doppler imaging with normal direction of blood flow
towards the liver.
IMPRESSION: 1. Contracted gallbladder without shadowing stone or sonographic
Murphy. Slight increased wall thickness which may be secondary to
contraction
2. Small hyperechoic liver masses, possibly representing hemangioma

## 2019-07-09 DIAGNOSIS — N342 Other urethritis: Secondary | ICD-10-CM | POA: Diagnosis not present

## 2019-07-09 MED FILL — AMOX-CLAV 500-125 MG TABLET: 500-125 | 5 days supply | Qty: 10 | Fill #0

## 2019-09-05 DIAGNOSIS — Z20828 Contact with and (suspected) exposure to other viral communicable diseases: Secondary | ICD-10-CM | POA: Diagnosis not present

## 2019-11-06 MED FILL — FLUoxetine HCL 40 MG CAPS: 40 | 90 days supply | Qty: 90 | Fill #1

## 2019-11-29 ENCOUNTER — Ambulatory Visit: Payer: 59 | Attending: Internal Medicine

## 2019-11-29 DIAGNOSIS — Z20822 Contact with and (suspected) exposure to covid-19: Secondary | ICD-10-CM | POA: Diagnosis not present

## 2019-12-01 LAB — NOVEL CORONAVIRUS, NAA: SARS-CoV-2, NAA: NOT DETECTED

## 2019-12-25 ENCOUNTER — Other Ambulatory Visit: Payer: Self-pay | Admitting: Orthopedic Surgery

## 2019-12-25 MED ORDER — PREDNISONE 10 MG PO TABS: 20.0000 mg | ORAL_TABLET | Freq: Every day | ORAL | 0 refills | Status: AC

## 2019-12-25 MED FILL — predniSONE 10 MG TABS: 10 | 30 days supply | Qty: 60 | Fill #0

## 2019-12-31 DIAGNOSIS — M9902 Segmental and somatic dysfunction of thoracic region: Secondary | ICD-10-CM | POA: Diagnosis not present

## 2019-12-31 DIAGNOSIS — M7541 Impingement syndrome of right shoulder: Secondary | ICD-10-CM | POA: Diagnosis not present

## 2019-12-31 DIAGNOSIS — M9901 Segmental and somatic dysfunction of cervical region: Secondary | ICD-10-CM | POA: Diagnosis not present

## 2019-12-31 DIAGNOSIS — M542 Cervicalgia: Secondary | ICD-10-CM | POA: Diagnosis not present

## 2020-01-03 DIAGNOSIS — M9901 Segmental and somatic dysfunction of cervical region: Secondary | ICD-10-CM | POA: Diagnosis not present

## 2020-01-03 DIAGNOSIS — M542 Cervicalgia: Secondary | ICD-10-CM | POA: Diagnosis not present

## 2020-01-03 DIAGNOSIS — M7541 Impingement syndrome of right shoulder: Secondary | ICD-10-CM | POA: Diagnosis not present

## 2020-01-03 DIAGNOSIS — M9902 Segmental and somatic dysfunction of thoracic region: Secondary | ICD-10-CM | POA: Diagnosis not present

## 2020-01-09 ENCOUNTER — Ambulatory Visit: Payer: 59 | Admitting: Family Medicine

## 2020-01-09 DIAGNOSIS — M7541 Impingement syndrome of right shoulder: Secondary | ICD-10-CM | POA: Diagnosis not present

## 2020-01-09 DIAGNOSIS — M542 Cervicalgia: Secondary | ICD-10-CM | POA: Diagnosis not present

## 2020-01-09 DIAGNOSIS — M9902 Segmental and somatic dysfunction of thoracic region: Secondary | ICD-10-CM | POA: Diagnosis not present

## 2020-01-09 DIAGNOSIS — M9901 Segmental and somatic dysfunction of cervical region: Secondary | ICD-10-CM | POA: Diagnosis not present

## 2020-05-12 DIAGNOSIS — Z6833 Body mass index (BMI) 33.0-33.9, adult: Secondary | ICD-10-CM | POA: Diagnosis not present

## 2020-05-12 DIAGNOSIS — Z01419 Encounter for gynecological examination (general) (routine) without abnormal findings: Secondary | ICD-10-CM | POA: Diagnosis not present

## 2020-05-14 DIAGNOSIS — Z114 Encounter for screening for human immunodeficiency virus [HIV]: Secondary | ICD-10-CM | POA: Diagnosis not present

## 2020-05-14 DIAGNOSIS — Z1329 Encounter for screening for other suspected endocrine disorder: Secondary | ICD-10-CM | POA: Diagnosis not present

## 2020-05-14 DIAGNOSIS — Z113 Encounter for screening for infections with a predominantly sexual mode of transmission: Secondary | ICD-10-CM | POA: Diagnosis not present

## 2020-05-14 DIAGNOSIS — Z1159 Encounter for screening for other viral diseases: Secondary | ICD-10-CM | POA: Diagnosis not present

## 2020-05-14 DIAGNOSIS — Z130 Encounter for screening for diseases of the blood and blood-forming organs and certain disorders involving the immune mechanism: Secondary | ICD-10-CM | POA: Diagnosis not present

## 2020-05-14 DIAGNOSIS — Z0000 Encounter for general adult medical examination without abnormal findings: Secondary | ICD-10-CM | POA: Diagnosis not present

## 2020-05-14 DIAGNOSIS — Z13220 Encounter for screening for lipoid disorders: Secondary | ICD-10-CM | POA: Diagnosis not present

## 2020-05-14 DIAGNOSIS — Z131 Encounter for screening for diabetes mellitus: Secondary | ICD-10-CM | POA: Diagnosis not present

## 2020-05-22 MED FILL — FLUoxetine HCL 40 MG CAPS: 40 | 90 days supply | Qty: 90 | Fill #0

## 2020-06-15 DIAGNOSIS — R7989 Other specified abnormal findings of blood chemistry: Secondary | ICD-10-CM | POA: Diagnosis not present

## 2020-06-17 ENCOUNTER — Other Ambulatory Visit (HOSPITAL_COMMUNITY): Payer: Self-pay | Admitting: Obstetrics & Gynecology

## 2020-06-17 MED FILL — CIPROFLOXACIN-DEXAMETHASONE: 0.3-0.1 | 7 days supply | Qty: 8 | Fill #0

## 2020-06-17 MED FILL — LEVOTHYROXINE SODIUM 25 MCG: 25 | 30 days supply | Qty: 30 | Fill #0

## 2020-07-31 MED FILL — LEVOTHYROXINE SODIUM 25 MCG: 25 | 30 days supply | Qty: 30 | Fill #1

## 2020-10-12 DIAGNOSIS — S40022A Contusion of left upper arm, initial encounter: Secondary | ICD-10-CM | POA: Diagnosis not present

## 2020-10-12 DIAGNOSIS — S63502A Unspecified sprain of left wrist, initial encounter: Secondary | ICD-10-CM | POA: Diagnosis not present
